# Patient Record
Sex: Male | Born: 1945
Health system: Southern US, Community
[De-identification: ages and names within clinical notes are randomized; demographics above are authoritative.]

## PROBLEM LIST (undated history)

## (undated) DIAGNOSIS — E538 Deficiency of other specified B group vitamins: Secondary | ICD-10-CM

## (undated) DIAGNOSIS — I10 Essential (primary) hypertension: Secondary | ICD-10-CM

## (undated) DIAGNOSIS — N400 Enlarged prostate without lower urinary tract symptoms: Secondary | ICD-10-CM

## (undated) DIAGNOSIS — Z860100 Personal history of colon polyps, unspecified: Secondary | ICD-10-CM

## (undated) DIAGNOSIS — C4490 Unspecified malignant neoplasm of skin, unspecified: Secondary | ICD-10-CM

## (undated) DIAGNOSIS — D649 Anemia, unspecified: Secondary | ICD-10-CM

## (undated) DIAGNOSIS — E785 Hyperlipidemia, unspecified: Secondary | ICD-10-CM

## (undated) DIAGNOSIS — R739 Hyperglycemia, unspecified: Secondary | ICD-10-CM

## (undated) HISTORY — DX: Hyperglycemia, unspecified: R73.9

## (undated) HISTORY — DX: Essential (primary) hypertension: I10

## (undated) HISTORY — PX: ELBOW SURGERY: SHX618

## (undated) HISTORY — DX: Personal history of colon polyps, unspecified: Z86.0100

## (undated) HISTORY — PX: BREAST EXCISIONAL BIOPSY: SUR124

## (undated) HISTORY — DX: Anemia, unspecified: D64.9

## (undated) HISTORY — DX: Deficiency of other specified B group vitamins: E53.8

## (undated) HISTORY — DX: Benign prostatic hyperplasia without lower urinary tract symptoms: N40.0

## (undated) HISTORY — PX: VASECTOMY: SHX75

## (undated) HISTORY — DX: Hyperlipidemia, unspecified: E78.5

## (undated) HISTORY — PX: OTHER SURGICAL HISTORY: SHX169

## (undated) HISTORY — PX: KNEE ARTHROSCOPY: SHX127

## (undated) HISTORY — DX: Personal history of colonic polyps: Z86.010

## (undated) HISTORY — DX: Unspecified malignant neoplasm of skin, unspecified: C44.90

## (undated) HISTORY — PX: REFRACTIVE SURGERY: SHX103

## (undated) HISTORY — PX: POLYPECTOMY: SHX149

---

## 2001-10-19 ENCOUNTER — Ambulatory Visit (HOSPITAL_BASED_OUTPATIENT_CLINIC_OR_DEPARTMENT_OTHER): Admission: RE | Admit: 2001-10-19 | Discharge: 2001-10-19 | Payer: Self-pay | Admitting: Internal Medicine

## 2002-11-08 HISTORY — PX: OTHER SURGICAL HISTORY: SHX169

## 2004-01-04 ENCOUNTER — Ambulatory Visit (HOSPITAL_COMMUNITY): Admission: RE | Admit: 2004-01-04 | Discharge: 2004-01-04 | Payer: Self-pay | Admitting: Orthopedic Surgery

## 2004-10-08 ENCOUNTER — Ambulatory Visit: Payer: Self-pay | Admitting: Internal Medicine

## 2004-12-16 ENCOUNTER — Ambulatory Visit: Payer: Self-pay | Admitting: Internal Medicine

## 2004-12-16 ENCOUNTER — Encounter: Admission: RE | Admit: 2004-12-16 | Discharge: 2004-12-16 | Payer: Self-pay | Admitting: Internal Medicine

## 2005-01-15 ENCOUNTER — Ambulatory Visit: Payer: Self-pay | Admitting: Gastroenterology

## 2005-02-25 ENCOUNTER — Ambulatory Visit: Payer: Self-pay | Admitting: Gastroenterology

## 2005-03-11 ENCOUNTER — Ambulatory Visit: Payer: Self-pay | Admitting: Internal Medicine

## 2005-07-15 ENCOUNTER — Ambulatory Visit: Payer: Self-pay | Admitting: Internal Medicine

## 2005-09-09 ENCOUNTER — Ambulatory Visit: Payer: Self-pay | Admitting: Internal Medicine

## 2006-09-12 ENCOUNTER — Ambulatory Visit: Payer: Self-pay | Admitting: Internal Medicine

## 2006-09-12 LAB — CONVERTED CEMR LAB
ALT: 25 units/L (ref 0–40)
Basophils Relative: 0.2 % (ref 0.0–1.0)
CO2: 31 meq/L (ref 19–32)
Calcium: 10 mg/dL (ref 8.4–10.5)
Chloride: 102 meq/L (ref 96–112)
Creatinine, Ser: 1.1 mg/dL (ref 0.4–1.5)
Creatinine,U: 102.8 mg/dL
GFR calc non Af Amer: 73 mL/min
Glomerular Filtration Rate, Af Am: 88 mL/min/{1.73_m2}
Glucose, Bld: 111 mg/dL — ABNORMAL HIGH (ref 70–99)
Lymphocytes Relative: 32.7 % (ref 12.0–46.0)
MCHC: 34 g/dL (ref 30.0–36.0)
MCV: 93.5 fL (ref 78.0–100.0)
Microalb Creat Ratio: 1.9 mg/g (ref 0.0–30.0)
Microalb, Ur: 0.2 mg/dL (ref 0.0–1.9)
Monocytes Absolute: 0.5 10*3/uL (ref 0.2–0.7)
Neutro Abs: 3.5 10*3/uL (ref 1.4–7.7)
PSA: 0.29 ng/mL (ref 0.10–4.00)
Platelets: 279 10*3/uL (ref 150–400)
RBC: 4.46 M/uL (ref 4.22–5.81)
Sodium: 140 meq/L (ref 135–145)
TSH: 1.25 microintl units/mL (ref 0.35–5.50)
WBC: 6 10*3/uL (ref 4.5–10.5)

## 2007-04-13 ENCOUNTER — Telehealth (INDEPENDENT_AMBULATORY_CARE_PROVIDER_SITE_OTHER): Payer: Self-pay | Admitting: *Deleted

## 2007-08-07 ENCOUNTER — Telehealth (INDEPENDENT_AMBULATORY_CARE_PROVIDER_SITE_OTHER): Payer: Self-pay | Admitting: *Deleted

## 2007-12-04 ENCOUNTER — Ambulatory Visit: Payer: Self-pay | Admitting: Internal Medicine

## 2007-12-04 DIAGNOSIS — M109 Gout, unspecified: Secondary | ICD-10-CM | POA: Insufficient documentation

## 2007-12-05 ENCOUNTER — Encounter (INDEPENDENT_AMBULATORY_CARE_PROVIDER_SITE_OTHER): Payer: Self-pay | Admitting: *Deleted

## 2008-02-02 ENCOUNTER — Telehealth (INDEPENDENT_AMBULATORY_CARE_PROVIDER_SITE_OTHER): Payer: Self-pay | Admitting: *Deleted

## 2008-02-05 ENCOUNTER — Telehealth (INDEPENDENT_AMBULATORY_CARE_PROVIDER_SITE_OTHER): Payer: Self-pay | Admitting: *Deleted

## 2008-10-10 ENCOUNTER — Telehealth (INDEPENDENT_AMBULATORY_CARE_PROVIDER_SITE_OTHER): Payer: Self-pay | Admitting: *Deleted

## 2008-12-05 ENCOUNTER — Ambulatory Visit: Payer: Self-pay | Admitting: Internal Medicine

## 2008-12-05 DIAGNOSIS — R7309 Other abnormal glucose: Secondary | ICD-10-CM | POA: Insufficient documentation

## 2008-12-05 DIAGNOSIS — N400 Enlarged prostate without lower urinary tract symptoms: Secondary | ICD-10-CM | POA: Insufficient documentation

## 2008-12-05 DIAGNOSIS — E785 Hyperlipidemia, unspecified: Secondary | ICD-10-CM | POA: Insufficient documentation

## 2008-12-05 DIAGNOSIS — I10 Essential (primary) hypertension: Secondary | ICD-10-CM | POA: Insufficient documentation

## 2008-12-09 ENCOUNTER — Encounter: Payer: Self-pay | Admitting: Internal Medicine

## 2009-07-01 ENCOUNTER — Ambulatory Visit: Payer: Self-pay | Admitting: Internal Medicine

## 2009-07-04 LAB — CONVERTED CEMR LAB
Basophils Absolute: 0 10*3/uL (ref 0.0–0.1)
Eosinophils Relative: 1.4 % (ref 0.0–5.0)
Folate: 17 ng/mL
HCT: 37.7 % — ABNORMAL LOW (ref 39.0–52.0)
Iron: 123 ug/dL (ref 42–165)
Lymphocytes Relative: 32 % (ref 12.0–46.0)
Lymphs Abs: 1.9 10*3/uL (ref 0.7–4.0)
Monocytes Relative: 8.7 % (ref 3.0–12.0)
Neutrophils Relative %: 57.7 % (ref 43.0–77.0)
Platelets: 223 10*3/uL (ref 150.0–400.0)
RDW: 12.7 % (ref 11.5–14.6)
Saturation Ratios: 27.7 % (ref 20.0–50.0)
T3 Uptake Ratio: 35.6 % (ref 22.5–37.0)
Transferrin: 317.5 mg/dL (ref 212.0–360.0)
Vitamin B-12: 205 pg/mL — ABNORMAL LOW (ref 211–911)
WBC: 6 10*3/uL (ref 4.5–10.5)

## 2009-07-07 ENCOUNTER — Telehealth (INDEPENDENT_AMBULATORY_CARE_PROVIDER_SITE_OTHER): Payer: Self-pay | Admitting: *Deleted

## 2009-07-07 ENCOUNTER — Encounter (INDEPENDENT_AMBULATORY_CARE_PROVIDER_SITE_OTHER): Payer: Self-pay | Admitting: *Deleted

## 2009-07-10 ENCOUNTER — Ambulatory Visit: Payer: Self-pay | Admitting: Internal Medicine

## 2009-07-10 LAB — CONVERTED CEMR LAB: OCCULT 3: NEGATIVE

## 2009-07-11 ENCOUNTER — Encounter (INDEPENDENT_AMBULATORY_CARE_PROVIDER_SITE_OTHER): Payer: Self-pay | Admitting: *Deleted

## 2009-09-18 ENCOUNTER — Telehealth (INDEPENDENT_AMBULATORY_CARE_PROVIDER_SITE_OTHER): Payer: Self-pay | Admitting: *Deleted

## 2009-12-08 ENCOUNTER — Ambulatory Visit: Payer: Self-pay | Admitting: Internal Medicine

## 2010-01-23 ENCOUNTER — Encounter (INDEPENDENT_AMBULATORY_CARE_PROVIDER_SITE_OTHER): Payer: Self-pay | Admitting: *Deleted

## 2010-01-28 ENCOUNTER — Telehealth: Payer: Self-pay | Admitting: Internal Medicine

## 2010-02-12 ENCOUNTER — Encounter (INDEPENDENT_AMBULATORY_CARE_PROVIDER_SITE_OTHER): Payer: Self-pay | Admitting: *Deleted

## 2010-02-23 ENCOUNTER — Encounter (INDEPENDENT_AMBULATORY_CARE_PROVIDER_SITE_OTHER): Payer: Self-pay | Admitting: *Deleted

## 2010-02-25 ENCOUNTER — Ambulatory Visit: Payer: Self-pay | Admitting: Gastroenterology

## 2010-03-08 HISTORY — PX: COLONOSCOPY W/ POLYPECTOMY: SHX1380

## 2010-03-10 ENCOUNTER — Ambulatory Visit: Payer: Self-pay | Admitting: Gastroenterology

## 2010-03-10 LAB — HM COLONOSCOPY

## 2010-03-12 ENCOUNTER — Encounter: Payer: Self-pay | Admitting: Gastroenterology

## 2010-06-01 ENCOUNTER — Ambulatory Visit: Payer: Self-pay | Admitting: Internal Medicine

## 2010-06-09 LAB — CONVERTED CEMR LAB
Basophils Absolute: 0 10*3/uL (ref 0.0–0.1)
Eosinophils Absolute: 0.1 10*3/uL (ref 0.0–0.7)
Hgb A1c MFr Bld: 6.1 % (ref 4.6–6.5)
Lymphocytes Relative: 38 % (ref 12.0–46.0)
MCHC: 35.1 g/dL (ref 30.0–36.0)
Neutrophils Relative %: 51.3 % (ref 43.0–77.0)
RDW: 13.5 % (ref 11.5–14.6)
Vitamin B-12: 183 pg/mL — ABNORMAL LOW (ref 211–911)

## 2010-06-17 ENCOUNTER — Ambulatory Visit: Payer: Self-pay | Admitting: Internal Medicine

## 2010-06-17 DIAGNOSIS — D510 Vitamin B12 deficiency anemia due to intrinsic factor deficiency: Secondary | ICD-10-CM | POA: Insufficient documentation

## 2010-09-01 ENCOUNTER — Ambulatory Visit: Payer: Self-pay | Admitting: Internal Medicine

## 2010-09-01 DIAGNOSIS — M79609 Pain in unspecified limb: Secondary | ICD-10-CM | POA: Insufficient documentation

## 2010-09-01 DIAGNOSIS — R109 Unspecified abdominal pain: Secondary | ICD-10-CM | POA: Insufficient documentation

## 2010-09-16 ENCOUNTER — Ambulatory Visit: Payer: Self-pay | Admitting: Internal Medicine

## 2010-10-08 ENCOUNTER — Ambulatory Visit: Payer: Self-pay | Admitting: Sports Medicine

## 2010-10-08 DIAGNOSIS — M722 Plantar fascial fibromatosis: Secondary | ICD-10-CM | POA: Insufficient documentation

## 2010-11-23 ENCOUNTER — Telehealth (INDEPENDENT_AMBULATORY_CARE_PROVIDER_SITE_OTHER): Payer: Self-pay | Admitting: *Deleted

## 2010-12-06 LAB — CONVERTED CEMR LAB
ALT: 22 units/L (ref 0–53)
AST: 25 units/L (ref 0–37)
AST: 29 units/L (ref 0–37)
Albumin: 4.4 g/dL (ref 3.5–5.2)
Albumin: 4.6 g/dL (ref 3.5–5.2)
Alkaline Phosphatase: 32 units/L — ABNORMAL LOW (ref 39–117)
Alkaline Phosphatase: 37 units/L — ABNORMAL LOW (ref 39–117)
BUN: 17 mg/dL (ref 6–23)
BUN: 20 mg/dL (ref 6–23)
Basophils Absolute: 0 10*3/uL (ref 0.0–0.1)
Basophils Absolute: 0 10*3/uL (ref 0.0–0.1)
Bilirubin, Direct: 0.1 mg/dL (ref 0.0–0.3)
Bilirubin, Direct: 0.1 mg/dL (ref 0.0–0.3)
CO2: 29 meq/L (ref 19–32)
CO2: 30 meq/L (ref 19–32)
Calcium: 10 mg/dL (ref 8.4–10.5)
Chloride: 100 meq/L (ref 96–112)
Chloride: 104 meq/L (ref 96–112)
Cholesterol, target level: 200 mg/dL
Cholesterol: 183 mg/dL (ref 0–200)
Creatinine, Ser: 1.2 mg/dL (ref 0.4–1.5)
Direct LDL: 103.8 mg/dL
Eosinophils Absolute: 0 10*3/uL (ref 0.0–0.7)
Eosinophils Absolute: 0.1 10*3/uL (ref 0.0–0.7)
Eosinophils Relative: 0.7 % (ref 0.0–5.0)
Folate: 10.4 ng/mL
GFR calc Af Amer: 88 mL/min
GFR calc non Af Amer: 65.16 mL/min (ref >60)
GFR calc non Af Amer: 72 mL/min
Glucose, Bld: 101 mg/dL — ABNORMAL HIGH (ref 70–99)
Glucose, Bld: 107 mg/dL — ABNORMAL HIGH (ref 70–99)
HDL goal, serum: 40 mg/dL
HDL: 55.9 mg/dL (ref >39.0)
HDL: 57.2 mg/dL (ref >39.0)
HDL: 74.9 mg/dL (ref >39.00)
Hemoglobin: 12.5 g/dL — ABNORMAL LOW (ref 13.0–17.0)
Hemoglobin: 13.3 g/dL (ref 13.0–17.0)
Hgb A1c MFr Bld: 5.7 % (ref 4.6–6.0)
Hgb A1c MFr Bld: 5.8 % (ref 4.6–6.0)
Hgb A1c MFr Bld: 6 % (ref 4.6–6.5)
Iron: 114 ug/dL (ref 42–165)
Lymphocytes Relative: 35 % (ref 12.0–46.0)
Lymphocytes Relative: 35.1 % (ref 12.0–46.0)
Lymphs Abs: 1.8 10*3/uL (ref 0.7–4.0)
MCHC: 33.5 g/dL (ref 30.0–36.0)
MCHC: 33.8 g/dL (ref 30.0–36.0)
MCV: 93.7 fL (ref 78.0–100.0)
Microalb Creat Ratio: 2.8 mg/g (ref 0.0–30.0)
Monocytes Absolute: 0.5 10*3/uL (ref 0.2–0.7)
Monocytes Absolute: 0.6 10*3/uL (ref 0.1–1.0)
Monocytes Relative: 10.5 % (ref 3.0–12.0)
Monocytes Relative: 7.6 % (ref 3.0–11.0)
Neutro Abs: 2.9 10*3/uL (ref 1.4–7.7)
Neutro Abs: 4 10*3/uL (ref 1.4–7.7)
Neutrophils Relative %: 63.6 % (ref 43.0–77.0)
PSA: 0.27 ng/mL (ref 0.10–4.00)
PSA: 0.28 ng/mL (ref 0.10–4.00)
Platelets: 209 10*3/uL (ref 150–400)
Platelets: 214 10*3/uL (ref 150.0–400.0)
Platelets: 263 10*3/uL (ref 150–400)
Potassium: 4.2 meq/L (ref 3.5–5.1)
RDW: 12 % (ref 11.5–14.6)
RDW: 12.4 % (ref 11.5–14.6)
Sodium: 136 meq/L (ref 135–145)
Sodium: 140 meq/L (ref 135–145)
Sodium: 142 meq/L (ref 135–145)
TSH: 0.82 microintl units/mL (ref 0.35–5.50)
TSH: 1.4 microintl units/mL (ref 0.35–5.50)
Total Bilirubin: 0.4 mg/dL (ref 0.3–1.2)
Total Bilirubin: 0.7 mg/dL (ref 0.3–1.2)
Total CHOL/HDL Ratio: 2.7
Total CHOL/HDL Ratio: 3
Total CHOL/HDL Ratio: 3.3
Total Protein: 7.1 g/dL (ref 6.0–8.3)
Triglycerides: 123 mg/dL (ref 0–149)
Uric Acid, Serum: 6.2 mg/dL (ref 4.0–7.8)
VLDL: 25 mg/dL (ref 0–40)
VLDL: 35.2 mg/dL (ref 0.0–40.0)
Vitamin B-12: 356 pg/mL (ref 211–911)
WBC: 6.1 10*3/uL (ref 4.5–10.5)

## 2010-12-09 ENCOUNTER — Encounter (INDEPENDENT_AMBULATORY_CARE_PROVIDER_SITE_OTHER): Payer: Managed Care, Other (non HMO) | Admitting: Internal Medicine

## 2010-12-09 ENCOUNTER — Ambulatory Visit: Admit: 2010-12-09 | Payer: Self-pay | Admitting: Internal Medicine

## 2010-12-09 ENCOUNTER — Other Ambulatory Visit: Payer: Self-pay | Admitting: Internal Medicine

## 2010-12-09 ENCOUNTER — Encounter: Payer: Self-pay | Admitting: Internal Medicine

## 2010-12-09 DIAGNOSIS — D510 Vitamin B12 deficiency anemia due to intrinsic factor deficiency: Secondary | ICD-10-CM

## 2010-12-09 DIAGNOSIS — Z136 Encounter for screening for cardiovascular disorders: Secondary | ICD-10-CM

## 2010-12-09 DIAGNOSIS — E785 Hyperlipidemia, unspecified: Secondary | ICD-10-CM

## 2010-12-09 DIAGNOSIS — Z860100 Personal history of colon polyps, unspecified: Secondary | ICD-10-CM | POA: Insufficient documentation

## 2010-12-09 DIAGNOSIS — M109 Gout, unspecified: Secondary | ICD-10-CM

## 2010-12-09 DIAGNOSIS — N400 Enlarged prostate without lower urinary tract symptoms: Secondary | ICD-10-CM

## 2010-12-09 DIAGNOSIS — Z0000 Encounter for general adult medical examination without abnormal findings: Secondary | ICD-10-CM

## 2010-12-09 DIAGNOSIS — R7309 Other abnormal glucose: Secondary | ICD-10-CM

## 2010-12-09 DIAGNOSIS — I10 Essential (primary) hypertension: Secondary | ICD-10-CM

## 2010-12-09 LAB — CBC WITH DIFFERENTIAL/PLATELET
Basophils Relative: 0.4 % (ref 0.0–3.0)
Eosinophils Absolute: 0.1 10*3/uL (ref 0.0–0.7)
MCHC: 34.9 g/dL (ref 30.0–36.0)
MCV: 95.2 fl (ref 78.0–100.0)
Monocytes Absolute: 0.5 10*3/uL (ref 0.1–1.0)
Neutrophils Relative %: 54.9 % (ref 43.0–77.0)
RBC: 3.74 Mil/uL — ABNORMAL LOW (ref 4.22–5.81)

## 2010-12-09 LAB — BASIC METABOLIC PANEL
BUN: 21 mg/dL (ref 6–23)
CO2: 29 mEq/L (ref 19–32)
Chloride: 104 mEq/L (ref 96–112)
Creatinine, Ser: 1.2 mg/dL (ref 0.4–1.5)
Glucose, Bld: 105 mg/dL — ABNORMAL HIGH (ref 70–99)

## 2010-12-09 LAB — TSH: TSH: 1.12 u[IU]/mL (ref 0.35–5.50)

## 2010-12-09 LAB — LDL CHOLESTEROL, DIRECT: Direct LDL: 100.3 mg/dL

## 2010-12-09 LAB — B12 AND FOLATE PANEL: Folate: 20.3 ng/mL (ref >5.9)

## 2010-12-09 LAB — PSA: PSA: 0.23 ng/mL (ref 0.10–4.00)

## 2010-12-09 LAB — IBC PANEL: Saturation Ratios: 22.3 % (ref 20.0–50.0)

## 2010-12-09 LAB — LIPID PANEL
Cholesterol: 193 mg/dL (ref 0–200)
Total CHOL/HDL Ratio: 3

## 2010-12-09 LAB — URIC ACID: Uric Acid, Serum: 6.4 mg/dL (ref 4.0–7.8)

## 2010-12-09 LAB — HEPATIC FUNCTION PANEL: Total Bilirubin: 0.4 mg/dL (ref 0.3–1.2)

## 2010-12-09 LAB — MICROALBUMIN / CREATININE URINE RATIO: Microalb, Ur: 0.2 mg/dL (ref 0.0–1.9)

## 2010-12-09 LAB — HEMOGLOBIN A1C: Hgb A1c MFr Bld: 6 % (ref 4.6–6.5)

## 2010-12-10 NOTE — Progress Notes (Signed)
Summary: Refill Request  Phone Note Refill Request Message from:  Fax from Pharmacy on November 23, 2010 9:58 AM  Refills Requested: Medication #1:  FENOFIBRATE 160 MG  TABS 1 by mouth qd  Medication #2:  RAMIPRIL 10 MG  CAPS 1 by mouth qd  Medication #3:  AMLODIPINE BESYLATE 5 MG TABS 1 once daily  Medication #4:  ACTOS TAB 15MG    Notes: Medication list says this was removed 09/26/10 due to bladder cancer risk [facility]    phone -[phone]   fax - [phone]    (use ref# [id number] if calling)  Next Appointment Scheduled: Wed 2/1     [facility] Initial call taken by: Jerolyn Shin,  November 23, 2010 10:11 AM  Follow-up for Phone Call        RX's faxed to: [phone]  **ACTOS NOT FAXED, REMOVED** Follow-up by: Shonna Chock CMA,  November 23, 2010 4:36 PM    Prescriptions: RAMIPRIL 10 MG  CAPS (RAMIPRIL) 1 by mouth qd  #90 x 0   Entered by:   Shonna Chock CMA   Authorized by:   Marga Melnick MD   Signed by:   Shonna Chock CMA on 11/23/2010   Method used:   Print then Give to Patient   RxID:   [id number] AMLODIPINE BESYLATE 5 MG TABS (AMLODIPINE BESYLATE) 1 once daily  #90 x 0   Entered by:   Shonna Chock CMA   Authorized by:   Marga Melnick MD   Signed by:   Shonna Chock CMA on 11/23/2010   Method used:   Print then Give to Patient   RxID:   [id number] FENOFIBRATE 160 MG  TABS (FENOFIBRATE) 1 by mouth qd  #90 x 0   Entered by:   Shonna Chock CMA   Authorized by:   Marga Melnick MD   Signed by:   Shonna Chock CMA on 11/23/2010   Method used:   Print then Give to Patient   RxID:   [id number]

## 2010-12-10 NOTE — Assessment & Plan Note (Signed)
Summary: CPX AND LAB/CDJ   Vital Signs:  Patient profile:   65 year old male Height:      69.5 inches Weight:      206 pounds BMI:     30.09 Temp:     98.6 degrees F oral Pulse rate:   52 / minute Resp:     14 per minute BP sitting:   124 / 86  (left arm) Cuff size:   large  Vitals Entered By: Shonna Chock (December 08, 2009 8:27 AM)  Comments REVIEWED MED LIST, PATIENT AGREED DOSE AND INSTRUCTION CORRECT    History of Present Illness: Troy House is here for a physical; he is asymptomatic. Flu shot taken 08/2009.  Preventive Screening-Counseling & Management  Caffeine-Diet-Exercise     Does Patient Exercise: no  Allergies: 1)  ! Pcn 2)  ! Hydrocodone  Past History:  Past Medical History: Gout, PMH of ( last flare 2006) Borderline anemia 2000 (B12,folate,iron panel WNL, PMH of ;Active  Blood Donor(once / year) Hyperlipidemia Hypertension Hyperglycemia, fasting , PMH of Benign prostatic hypertrophy  Past Surgical History: Vasectomy Elbow surgery bilaterally for osteophytes Lump removed Lt breast ( benign) Arthroscopy Lt knee Lasik sx OD X 2 Frozen Lt shoulder sx Colonoscopy neg 2001; Endo 2001 neg, Dr Arlyce Dice  Family History: Family History Other cancer-testicular MGF Father: HTN,CVA @ 56,MI @ 37(3 ppd smoker) Mother: adrenal insufficiency ?due to steroids for ?,HTN Siblings: sister ? Lupus Variant; sister DM,resolved post Bariatric Surgery; Niece Bipola disorder; Nephew ADHD; Niece ADHD; sister ?thyroid malignancy 08/2008,S/P resection & radiation(18 days)  Social History: Former Smoker: quit 1974 Alcohol use-yes: socially Occupation: Lawyer; starting an on line business Married Regular exercise-no(since 10/2009 due to work issues) Does Patient Exercise:  no  Review of Systems       The patient complains of decreased hearing.  The patient denies anorexia, fever, vision loss, hoarseness, chest pain, syncope, dyspnea on exertion, peripheral edema,  abdominal pain, melena, hematochezia, severe indigestion/heartburn, suspicious skin lesions, depression, unusual weight change, abnormal bleeding, enlarged lymph nodes, and angioedema.         Weight up 4# over holidays. Hearing aids bilaterally (dog chewed  them up  12/03/2009 ! ) Resp:  Denies cough, excessive snoring, hypersomnolence, morning headaches, shortness of breath, and sputum productive; He takes afternoon nap.. GU:  Denies discharge, dysuria, hematuria, incontinence, urinary frequency, and urinary hesitancy; Nocturia X 0-3 depending on fluid intake.  Physical Exam  General:  well-nourished,in no acute distress; alert,appropriate and cooperative throughout examination Head:  Normocephalic and atraumatic without obvious abnormalities. Pattern  alopecia Eyes:  No corneal or conjunctival inflammation noted.  Perrla. Funduscopic exam benign, without hemorrhages, exudates or papilledema.  Ears:  External ear exam shows no significant lesions or deformities.  Otoscopic examination reveals clear canals, tympanic membranes are intact bilaterally without bulging, retraction, inflammation or discharge. Hearing is grossly normal bilaterally to speech ( loss of high tones by PMH). Nose:  External nasal examination shows no deformity or inflammation. Nasal mucosa are pink and moist without lesions or exudates. Mouth:  Oral mucosa and oropharynx without lesions or exudates.  Teeth in good repair. Neck:  No deformities, masses, or tenderness noted. Lungs:  Normal respiratory effort, chest expands symmetrically. Lungs are clear to auscultation, no crackles or wheezes. Heart:  regular rhythm and bradycardia.   S 4 with slurring Abdomen:  Bowel sounds positive,abdomen soft and non-tender without masses, organomegaly or hernias noted. Rectal:  No external abnormalities noted. Normal sphincter tone. No rectal  masses or tenderness. Genitalia:  Testes bilaterally descended without nodularity, tenderness or  masses. Scar tissue L scrotum.No penis lesions or urethral discharge. Prostate:  Prostate gland firm and smooth,ULN to 1+  enlargement, w/o nodularity, tenderness, mass, asymmetry or induration. Msk:  No deformity or scoliosis noted of thoracic or lumbar spine.   Pulses:  R and L carotid,radial,dorsalis pedis and posterior tibial pulses are full and equal bilaterally Extremities:  No clubbing, cyanosis, edema, or deformity noted with normal full range of motion of all joints.   Neurologic:  alert & oriented X3 and DTRs symmetrical and normal.   Skin:  Intact without suspicious lesions or rashes Cervical Nodes:  No lymphadenopathy noted Axillary Nodes:  No palpable lymphadenopathy Inguinal Nodes:  No significant adenopathy Psych:  memory intact for recent and remote, normally interactive, and good eye contact.     Impression & Recommendations:  Problem # 1:  PREVENTIVE HEALTH CARE (ICD-V70.0)  Orders: Venipuncture (59563) TLB-Lipid Panel (80061-LIPID) TLB-B12 + Folate Pnl (87564_33295-J88/CZY) TLB-IBC Pnl (Iron/FE;Transferrin) (83550-IBC) TLB-BMP (Basic Metabolic Panel-BMET) (80048-METABOL) TLB-CBC Platelet - w/Differential (85025-CBCD) TLB-Hepatic/Liver Function Pnl (80076-HEPATIC) TLB-TSH (Thyroid Stimulating Hormone) (84443-TSH) TLB-Uric Acid, Blood (84550-URIC) TLB-A1C / Hgb A1C (Glycohemoglobin) (83036-A1C) TLB-PSA (Prostate Specific Antigen) (84153-PSA) EKG w/ Interpretation (93000) Gastroenterology Referral (GI)  Problem # 2:  HYPERLIPIDEMIA (ICD-272.4)  His updated medication list for this problem includes:    Fenofibrate 160 Mg Tabs (Fenofibrate) .Marland Kitchen... 1 by mouth qd  Orders: Venipuncture (60630) TLB-Lipid Panel (80061-LIPID)  Problem # 3:  HYPERTENSION (ICD-401.9)  Controlled His updated medication list for this problem includes:    Ramipril 10 Mg Caps (Ramipril) .Marland Kitchen... 1 by mouth qd    Amlodipine Besylate 5 Mg Tabs (Amlodipine besylate) .Marland Kitchen... 1 once  daily  Orders: Venipuncture (16010) EKG w/ Interpretation (93000)  Problem # 4:  ANEMIA, MILD (ICD-285.9)  PMH of His updated medication list for this problem includes:    Nascobal 500 Mcg/0.32ml Soln (Cyanocobalamin) ..... One spray, one nostril, once a week  Orders: Venipuncture (93235) TLB-B12 + Folate Pnl (57322_02542-H06/CBJ) TLB-IBC Pnl (Iron/FE;Transferrin) (83550-IBC)  Problem # 5:  FASTING HYPERGLYCEMIA (ICD-790.29)  His updated medication list for this problem includes:    Actos 15 Mg Tabs (Pioglitazone hcl) .Marland Kitchen... 1 by mouth qd  Orders: Venipuncture (62831) TLB-A1C / Hgb A1C (Glycohemoglobin) (83036-A1C)  Problem # 6:  HYPERPLASIA PROSTATE UNS W/O UR OBST & OTH LUTS (ICD-600.90)  Orders: TLB-PSA (Prostate Specific Antigen) (84153-PSA)  Problem # 7:  SCREENING, COLON CANCER (ICD-V76.51)  due for screening, last in 2001  Orders: Gastroenterology Referral (GI)  Problem # 8:  GOUT (ICD-274.9)  His updated medication list for this problem includes:    Allopurinol 300 Mg Tabs (Allopurinol) .Marland Kitchen... 1/2 tab qd  Orders: Venipuncture (51761) TLB-Uric Acid, Blood (84550-URIC)  Complete Medication List: 1)  Allopurinol 300 Mg Tabs (Allopurinol) .... 1/2 tab qd 2)  Ramipril 10 Mg Caps (Ramipril) .Marland Kitchen.. 1 by mouth qd 3)  Actos 15 Mg Tabs (Pioglitazone hcl) .Marland Kitchen.. 1 by mouth qd 4)  Fenofibrate 160 Mg Tabs (Fenofibrate) .Marland Kitchen.. 1 by mouth qd 5)  Aspirin Adult Low Strength 81 Mg Tbec (Aspirin) .... 3 by mouth once daily 6)  Amlodipine Besylate 5 Mg Tabs (Amlodipine besylate) .Marland Kitchen.. 1 once daily 7)  Nascobal 500 Mcg/0.44ml Soln (Cyanocobalamin) .... One spray, one nostril, once a week 8)  Indomethacin 50 Mg Caps (Indomethacin) .Marland Kitchen.. 1 by mouth three times a day as needed 9)  Vitamin D3 1000 Unit Caps (Cholecalciferol) .Marland Kitchen.. 1 by mouth once  daily 10)  Epi Pen  .... As needed 11)  Viagra 100 Mg Tabs (Sildenafil citrate) .... 1/2 -1 once daily as needed  Patient Instructions: 1)   Check your Blood Pressure regularly. If it is above:135/85 ON AVERAGE  you should make an appointment. 2)  A colonoscopy will be scheduled if due. Prescriptions: VIAGRA 100 MG TABS (SILDENAFIL CITRATE) 1/2 -1 once daily as needed  #6 x 11   Entered and Authorized by:   Marga Melnick MD   Signed by:   Marga Melnick MD on 12/08/2009   Method used:   Print then Give to Patient   RxID:   [id number] FENOFIBRATE 160 MG  TABS (FENOFIBRATE) 1 by mouth qd  #90 x 3   Entered and Authorized by:   Marga Melnick MD   Signed by:   Marga Melnick MD on 12/08/2009   Method used:   Print then Give to Patient   RxID:   [id number] EPI PEN as needed  #2 x 1   Entered and Authorized by:   Marga Melnick MD   Signed by:   Marga Melnick MD on 12/08/2009   Method used:   Print then Give to Patient   RxID:   [id number] NASCOBAL 500 MCG/0.1ML SOLN (CYANOCOBALAMIN) One spray, one nostril, once a week  #1 x 11   Entered and Authorized by:   Marga Melnick MD   Signed by:   Marga Melnick MD on 12/08/2009   Method used:   Print then Give to Patient   RxID:   [id number] AMLODIPINE BESYLATE 5 MG TABS (AMLODIPINE BESYLATE) 1 once daily  #90 x 3   Entered and Authorized by:   Marga Melnick MD   Signed by:   Marga Melnick MD on 12/08/2009   Method used:   Print then Give to Patient   RxID:   [id number] ACTOS 15 MG  TABS (PIOGLITAZONE HCL) 1 by mouth qd  #90 x 3   Entered and Authorized by:   Marga Melnick MD   Signed by:   Marga Melnick MD on 12/08/2009   Method used:   Print then Give to Patient   RxID:   [id number] RAMIPRIL 10 MG  CAPS (RAMIPRIL) 1 by mouth qd  #90 x 3   Entered and Authorized by:   Marga Melnick MD   Signed by:   Marga Melnick MD on 12/08/2009   Method used:   Print then Give to Patient   RxID:   [id number] ALLOPURINOL 300 MG  TABS (ALLOPURINOL) 1/2 tab qd  #90 x 1   Entered and Authorized by:   Marga Melnick MD   Signed by:   Marga Melnick MD on 12/08/2009   Method used:   Print then Give to Patient   RxID:   [id number]

## 2010-12-10 NOTE — Letter (Signed)
Summary: Patient Notice- Polyp Results  [facility]  [address] [place], [place] [address]   Phone: [phone]  Fax: [phone]        Mar 12, 2010 MRN: [id number]    Troy MAYSONET [address] [place], [place]  [address]    Dear Mr. Gullo,  I am pleased to inform you that the colon polyp(s) removed during your recent colonoscopy was (were) found to be benign (no cancer detected) upon pathologic examination.  I recommend you have a repeat colonoscopy examination in _5 years to look for recurrent polyps, as having colon polyps increases your risk for having recurrent polyps or even colon cancer in the future.  Should you develop new or worsening symptoms of abdominal pain, bowel habit changes or bleeding from the rectum or bowels, please schedule an evaluation with either your primary care physician or with me.  Additional information/recommendations:  __ No further action with gastroenterology is needed at this time. Please      follow-up with your primary care physician for your other healthcare      needs.  __ Please call [phone] to schedule a return visit to review your      situation.  __ Please keep your follow-up visit as already scheduled.  _x_ Continue treatment plan as outlined the day of your exam.  Please call us if you are having persistent problems or have questions about your condition that have not been fully answered at this time.  Sincerely,  Louis Meckel MD  This letter has been electronically signed by your physician.  Appended Document: Patient Notice- Polyp Results letter mailed 5.10.11

## 2010-12-10 NOTE — Assessment & Plan Note (Signed)
Summary: NP,HEEL PAIN,MC   Vital Signs:  Patient profile:   65 year old male BP sitting:   144 / 81  Vitals Entered By: Lillia Pauls CMA (October 08, 2010 2:23 PM)  History of Present Illness: Patient does regular square dancing has had some rare heel pain 2 mos ago after dancing pain became persistent and more severe over left heel  seen by Dr Alwyn Ren and started on Mobic which had helped pain about 60% referred here for eval  has morning pain pain if not wearing a birkenstock = which helps relieve pain well  Allergies: 1)  ! Pcn 2)  ! Hydrocodone  Physical Exam  General:  Well-developed,well-nourished,in no acute distress; alert,appropriate and cooperative throughout examination Msk:  Rt heel shows TTP over os calcis and also over medial insertion to PF good motin of great toe mild pronation on standing bilat mod loss of long arch mod loss of trans arch w 2nd MT head dropped somewhat bilat  walking gait shows no limp and mod pronation bilat Additional Exam:  MSK [place] thickened area of scar tissue over os calcis on RT with calcification in mid portion of PF This measures 0.72 PF on left measures only 0.47   Impression & Recommendations:  Problem # 1:  HEEL PAIN, RIGHT (ICD-729.5)  There is some element of os calcis contusion prob from dancing and note that his shoes have worn heels suggested changing these for dance most sxs seem 2/2 PF  Orders: [place] LIMITED (95284)  Problem # 2:  PLANTAR FASCIITIS, RIGHT (ICD-728.71)  His updated medication list for this problem includes:    Indomethacin 50 Mg Caps (Indomethacin) .Marland Kitchen... 1 by mouth three times a day as needed    Meloxicam 7.5 Mg Tabs (Meloxicam) .Marland Kitchen... 1 two times a day as needed pelvic pain(hold aspirin)  cont MOBIC as needed pain given exercises and stretches use arch strap for dancing  consider rescan in 3 mos if pain persists if steadily resolving may return prn  Orders: [place] LIMITED (13244)  Complete  Medication List: 1)  Allopurinol 300 Mg Tabs (Allopurinol) .... 1/2 tab qd 2)  Ramipril 10 Mg Caps (Ramipril) .Marland Kitchen.. 1 by mouth qd 3)  Fenofibrate 160 Mg Tabs (Fenofibrate) .Marland Kitchen.. 1 by mouth qd 4)  Amlodipine Besylate 5 Mg Tabs (Amlodipine besylate) .Marland Kitchen.. 1 once daily 5)  Indomethacin 50 Mg Caps (Indomethacin) .Marland Kitchen.. 1 by mouth three times a day as needed 6)  Vitamin D3 1000 Unit Caps (Cholecalciferol) .Marland Kitchen.. 1 by mouth once daily 7)  Epi Pen  .... As needed 8)  Viagra 100 Mg Tabs (Sildenafil citrate) .... 1/2 -1 once daily as needed 9)  Nascobal 500 Mcg/0.50ml Soln (Cyanocobalamin) .Marland Kitchen.. 1 spray weekly 10)  Meloxicam 7.5 Mg Tabs (Meloxicam) .Marland Kitchen.. 1 two times a day as needed pelvic pain(hold aspirin) 11)  Nitroglycerin 0.1 Mg/hr Pt24 (Nitroglycerin) .... Apply to area of pain once daily   Orders Added: 1)  Consultation Level II [99242] 2)  [place] LIMITED [01027]

## 2010-12-10 NOTE — Letter (Signed)
Summary: [facility] Instructions  [facility]  [address] [place], [place] [address]   Phone: [phone]  Fax: [phone]       Troy House    Dec 24, 1945    MRN: [id number]        Procedure Day /Date: Tuesday 03/10/2010     Arrival Time: 9:30 am      Procedure Time: 10:30 am     Location of Procedure:                    _ x_  [facility] (4th Floor)                        PREPARATION FOR COLONOSCOPY WITH MOVIPREP   Starting 5 days prior to your procedure Thursday 4/28 do not eat nuts, seeds, popcorn, corn, beans, peas,  salads, or any raw vegetables.  Do not take any fiber supplements (e.g. Metamucil, Citrucel, and Benefiber).  THE DAY BEFORE YOUR PROCEDURE         DATE: Monday 5/2_  1.  Drink clear liquids the entire day-NO SOLID FOOD  2.  Do not drink anything colored red or purple.  Avoid juices with pulp.  No orange juice.  3.  Drink at least 64 oz. (8 glasses) of fluid/clear liquids during the day to prevent dehydration and help the prep work efficiently.  CLEAR LIQUIDS INCLUDE: Water Jello Ice Popsicles Tea (sugar ok, no milk/cream) Powdered fruit flavored drinks Coffee (sugar ok, no milk/cream) Gatorade Juice: apple, white grape, white cranberry  Lemonade Clear bullion, consomm, broth Carbonated beverages (any kind) Strained chicken noodle soup Hard Candy                             4.  In the morning, mix first dose of MoviPrep solution:    Empty 1 Pouch A and 1 Pouch B into the disposable container    Add lukewarm drinking water to the top line of the container. Mix to dissolve    Refrigerate (mixed solution should be used within 24 hrs)  5.  Begin drinking the prep at 5:00 p.m. The MoviPrep container is divided by 4 marks.   Every 15 minutes drink the solution down to the next mark (approximately 8 oz) until the full liter is complete.   6.  Follow completed prep with 16 oz of clear liquid of your choice (Nothing red  or purple).  Continue to drink clear liquids until bedtime.  7.  Before going to bed, mix second dose of MoviPrep solution:    Empty 1 Pouch A and 1 Pouch B into the disposable container    Add lukewarm drinking water to the top line of the container. Mix to dissolve    Refrigerate  THE DAY OF YOUR PROCEDURE      DATE: Tuesday 5/3  Beginning at 5:30 a.m. (5 hours before procedure):         1. Every 15 minutes, drink the solution down to the next mark (approx 8 oz) until the full liter is complete.  2. Follow completed prep with 16 oz. of clear liquid of your choice.    3. You may drink clear liquids until 8:30 am (2 HOURS BEFORE PROCEDURE).   MEDICATION INSTRUCTIONS  Unless otherwise instructed, you should take regular prescription medications with a small sip of water   as early as possible the morning of your  procedure.           OTHER INSTRUCTIONS  You will need a responsible adult at least 64 years of age to accompany you and drive you home.   This person must remain in the waiting room during your procedure.  Wear loose fitting clothing that is easily removed.  Leave jewelry and other valuables at home.  However, you may wish to bring a book to read or  an iPod/MP3 player to listen to music as you wait for your procedure to start.  Remove all body piercing jewelry and leave at home.  Total time from sign-in until discharge is approximately 2-3 hours.  You should go home directly after your procedure and rest.  You can resume normal activities the  day after your procedure.  The day of your procedure you should not:   Drive   Make legal decisions   Operate machinery   Drink alcohol   Return to work  You will receive specific instructions about eating, activities and medications before you leave.    The above instructions have been reviewed and explained to me by   Clide Cliff, RN_______________________    I fully understand and can verbalize  these instructions _____________________________ Date _________

## 2010-12-10 NOTE — Progress Notes (Signed)
Summary: refill-hopp  Phone Note Refill Request Call back at Work Phone [phone] Message from:  Patient  Refills Requested: Medication #1:  INDOMETHACIN 50 MG CAPS 1 by mouth three times a day as needed [facility]   pt left vm that he went to pharmacy and med was not there and he had requested a refill. pt would also like to know when he is to f/u with dr Kolsen Choe. left message to call office.  per labs done 12-08-09 pt to f/u  In 6 months recheck CBC & B12 level & a Hemoglobin electrophoresis to evaluate the anemia......Marland KitchenFelecia Deloach CMA  January 28, 2010 12:13 PM   Initial call taken by: Jeremy Johann CMA,  January 28, 2010 10:41 AM  Follow-up for Phone Call        last OV 12-08-09, never filled by you.Felecia Deloach CMA  January 28, 2010 12:24 PM   pt aware, labs scheduled...........Marland KitchenFelecia Deloach CMA  January 28, 2010 12:24 PM   Additional Follow-up for Phone Call Additional follow up Details #1::        OK # 30 Additional Follow-up by: Marga Melnick MD,  January 28, 2010 2:14 PM    Additional Follow-up for Phone Call Additional follow up Details #2::    left pt detail message rx sent to pharmacy...............Marland KitchenFelecia Deloach CMA  January 28, 2010 2:23 PM   Prescriptions: INDOMETHACIN 50 MG CAPS (INDOMETHACIN) 1 by mouth three times a day as needed  #30 x 0   Entered by:   Jeremy Johann CMA   Authorized by:   Marga Melnick MD   Signed by:   Jeremy Johann CMA on 01/28/2010   Method used:   Faxed to ...       [facility] [phone][facility] (retail)       [address]       [place], [place]  [address]       Ph: [phone]       Fax: [phone]   RxID:   [id number]

## 2010-12-10 NOTE — Miscellaneous (Signed)
Summary: REC COL...AS.  Clinical Lists Changes  Medications: Added new medication of MOVIPREP 100 GM  SOLR (PEG-KCL-NACL-NASULF-NA ASC-C) As directed - Signed Rx of MOVIPREP 100 GM  SOLR (PEG-KCL-NACL-NASULF-NA ASC-C) As directed;  #1 x 0;  Signed;  Entered by: Clide Cliff RN;  Authorized by: Louis Meckel MD;  Method used: [facility] to [facility] [phone]*, [address] [place], [place], [place]  [address], Ph: [phone], Fax: [phone] Allergies: Changed allergy or adverse reaction from PCN to PCN Changed allergy or adverse reaction from HYDROCODONE to HYDROCODONE    Prescriptions: MOVIPREP 100 GM  SOLR (PEG-KCL-NACL-NASULF-NA ASC-C) As directed  #1 x 0   Entered by:   Clide Cliff RN   Authorized by:   Louis Meckel MD   Signed by:   Clide Cliff RN on 02/25/2010   Method used:   Electronically to        [facility] [phone]* (retail)       [address]       [place], [place]  [address]       Ph: [phone]       Fax: [phone]   RxID:   [id number]

## 2010-12-10 NOTE — Assessment & Plan Note (Signed)
Summary: PAIN BACK OF LEG & HEEL/CBS   Vital Signs:  Patient profile:   65 year old male Weight:      211.6 pounds BMI:     30.91 Temp:     98.3 degrees F oral Pulse rate:   56 / minute Resp:     16 per minute BP sitting:   114 / 78  (left arm) Cuff size:   large  Vitals Entered By: Shonna Chock CMA (September 01, 2010 1:54 PM) CC: Pain upper left leg and right heel pain ( since 3rd week in Aug-2011), Back pain, Lower Extremity Joint pain   CC:  Pain upper left leg and right heel pain ( since 3rd week in Aug-2011), Back pain, and Lower Extremity Joint pain.  History of Present Illness: Back Pain      This is a 65 year old man who presents with  sharp , constant  L buttock pain.  The patient denies fever, chills, weakness, loss of sensation, fecal incontinence, urinary incontinence, urinary retention, dysuria, and inability to work.  The pain is located in the left  inferior buttock  The pain began suddenly and after  camping in 03/2010  The pain does not  radiate.The pain is made worse by activity.  The pain is made better by NSAID medications, generic Indocin.  Risk factors for serious underlying conditions include duration of pain > 1 month and age >= 50 years.   Lower Extremity Joint Pain      The patient also presents with Lower Extremity Joint pain @ R heel since 3rd week in 06/2010.  The patient denies swelling, redness, and stiffness for >1 hr.  The pain began suddenly and with a direct blow, the next am after square dancing.  The pain is described as aching.  The patient denies the following symptoms: rash, photosensitivity, eye symptoms, diarrhea, and dysuria.  Indocin helps this also. PMH of gout , last flare 1 year ago."This is nothing loike gout".  Current Medications (verified): 1)  Allopurinol 300 Mg  Tabs (Allopurinol) .... 1/2 Tab Qd 2)  Ramipril 10 Mg  Caps (Ramipril) .Marland Kitchen.. 1 By Mouth Qd 3)  Actos 15 Mg  Tabs (Pioglitazone Hcl) .Marland Kitchen.. 1 By Mouth Qd 4)  Fenofibrate 160 Mg   Tabs (Fenofibrate) .Marland Kitchen.. 1 By Mouth Qd 5)  Aspirin Adult Low Strength 81 Mg Tbec (Aspirin) .... 3 By Mouth Once Daily 6)  Amlodipine Besylate 5 Mg Tabs (Amlodipine Besylate) .Marland Kitchen.. 1 Once Daily 7)  Indomethacin 50 Mg Caps (Indomethacin) .Marland Kitchen.. 1 By Mouth Three Times A Day As Needed 8)  Vitamin D3 1000 Unit Caps (Cholecalciferol) .Marland Kitchen.. 1 By Mouth Once Daily 9)  Epi Pen .... As Needed 10)  Viagra 100 Mg Tabs (Sildenafil Citrate) .... 1/2 -1 Once Daily As Needed 11)  Nascobal 500 Mcg/0.35ml Soln (Cyanocobalamin) .Marland Kitchen.. 1 Spray Weekly  Allergies: 1)  ! Pcn 2)  ! Hydrocodone  Review of Systems General:  Denies chills, sweats, and weight loss. GI:  Denies abdominal pain, bloody stools, and dark tarry stools. Derm:  Denies lesion(s) and rash. Neuro:  Denies brief paralysis, numbness, and tingling.  Physical Exam  General:  well-nourished,in no acute distress; alert,appropriate and cooperative throughout examination Abdomen:  Bowel sounds positive,abdomen soft and non-tender without masses, organomegaly or hernias noted. No AAA Genitalia:  Testes bilaterally descended without nodularity, tenderness or masses. No scrotal masses or lesions. No penis lesions or urethral discharge. L varicocele.   Msk:  Coccyx non tender to  palpation Pulses:  R and L femoral,dorsalis pedis and posterior tibial pulses are full and equal bilaterally Extremities:  No clubbing, cyanosis, edema, or deformity noted with normal full range of motion of all joints.   No clinical plantar fasciitis. Slight pes  planus. Neg SLR. No pain to compression or percussion  R foot Neurologic:  alert & oriented X3, strength normal in all extremities, toe  gait normal but pain R heel with heel walking, and DTRs symmetrical and normal.   Skin:  Intact without suspicious lesions or rashes Cervical Nodes:  No lymphadenopathy noted Axillary Nodes:  No palpable lymphadenopathy Inguinal Nodes:  No significant adenopathy   Impression &  Recommendations:  Problem # 1:  PELVIC PAIN, LEFT (ICD-789.09)  L inferior pelvis, not coccygeal pain His updated medication list for this problem includes:    Aspirin Adult Low Strength 81 Mg Tbec (Aspirin) .Marland KitchenMarland KitchenMarland KitchenMarland Kitchen 3 by mouth once daily    Indomethacin 50 Mg Caps (Indomethacin) .Marland Kitchen... 1 by mouth three times a day as needed    Meloxicam 7.5 Mg Tabs (Meloxicam) .Marland Kitchen... 1 two times a day as needed pelvic pain(hold aspirin)  Orders: T-Pelvis 1or 2 views (72170TC)  Problem # 2:  HEEL PAIN, RIGHT (ICD-729.5) Plantar Fasciitis  variant   Problem # 3:  GOUT (ICD-274.9) PMH of His updated medication list for this problem includes:    Allopurinol 300 Mg Tabs (Allopurinol) .Marland Kitchen... 1/2 tab qd  Complete Medication List: 1)  Allopurinol 300 Mg Tabs (Allopurinol) .... 1/2 tab qd 2)  Ramipril 10 Mg Caps (Ramipril) .Marland Kitchen.. 1 by mouth qd 3)  Actos 15 Mg Tabs (Pioglitazone hcl) .Marland Kitchen.. 1 by mouth qd 4)  Fenofibrate 160 Mg Tabs (Fenofibrate) .Marland Kitchen.. 1 by mouth qd 5)  Aspirin Adult Low Strength 81 Mg Tbec (Aspirin) .... 3 by mouth once daily 6)  Amlodipine Besylate 5 Mg Tabs (Amlodipine besylate) .Marland Kitchen.. 1 once daily 7)  Indomethacin 50 Mg Caps (Indomethacin) .Marland Kitchen.. 1 by mouth three times a day as needed 8)  Vitamin D3 1000 Unit Caps (Cholecalciferol) .Marland Kitchen.. 1 by mouth once daily 9)  Epi Pen  .... As needed 10)  Viagra 100 Mg Tabs (Sildenafil citrate) .... 1/2 -1 once daily as needed 11)  Nascobal 500 Mcg/0.27ml Soln (Cyanocobalamin) .Marland Kitchen.. 1 spray weekly 12)  Meloxicam 7.5 Mg Tabs (Meloxicam) .Marland Kitchen.. 1 two times a day as needed pelvic pain(hold aspirin)  Other Orders: Admin 1st Vaccine ([address]) Flu Vaccine 89yrs + (60454)  Patient Instructions: 1)  Arch supports when ambulating. Flu Vaccine Consent Questions     Do you have a history of severe allergic reactions to this vaccine? no    Any prior history of allergic reactions to egg and/or gelatin? no    Do you have a sensitivity to the preservative Thimersol? no    Do you  have a past history of Guillan-Barre Syndrome? no    Do you currently have an acute febrile illness? no    Have you ever had a severe reaction to latex? no    Vaccine information given and explained to patient? yes    Are you currently pregnant? no    Lot Number:[id number]   Exp Date:05/08/2011   Site Given  Left Deltoid IMPrescriptions: MELOXICAM 7.5 MG TABS (MELOXICAM) 1 two times a day as needed pelvic pain(hold aspirin)  #30 x 0   Entered and Authorized by:   Marga Melnick MD   Signed by:   Marga Melnick MD on 09/01/2010   Method used:  Print then Give to Patient   RxID:   [id number]    Orders Added: 1)  Admin 1st Vaccine [90471] 2)  Flu Vaccine 15yrs + [14782] 3)  Est. Patient Level IV [95621] 4)  T-Pelvis 1or 2 views [72170TC]   .lbflu

## 2010-12-10 NOTE — Assessment & Plan Note (Signed)
Summary: concered about actos/backpain/kn   Vital Signs:  Patient profile:   65 year old male Weight:      212.6 pounds BMI:     31.06 Temp:     98.3 degrees F oral Pulse rate:   56 / minute Resp:     14 per minute BP sitting:   126 / 74  (left arm) Cuff size:   large  Vitals Entered By: Shonna Chock CMA (September 16, 2010 8:06 AM) CC: Back concerns and actos concerns , Lower Extremity Joint pain   CC:  Back concerns and actos concerns  and Lower Extremity Joint pain.  History of Present Illness: Troy House  presents with  pain  in the right foot anterior to heel on the sole of the R foot & L buttocks.  The pain has   improved 20% since August . NSAIDS help more than the Meloxicam.  The pain is described as aching; it is aggravated by walking up hills & square dancing.  The patient denies the following symptoms: fever, rash, eye symptoms, diarrhea, and dysuria. His L inferior buttocks pain has improved 50% since 03/2010. Additionally he & his wife have concerns about bladder cancer risk with Actos.He has not been checking glucoses.  Current Medications (verified): 1)  Allopurinol 300 Mg  Tabs (Allopurinol) .... 1/2 Tab Qd 2)  Ramipril 10 Mg  Caps (Ramipril) .Marland Kitchen.. 1 By Mouth Qd 3)  Actos 15 Mg  Tabs (Pioglitazone Hcl) .Marland Kitchen.. 1 By Mouth Qd 4)  Fenofibrate 160 Mg  Tabs (Fenofibrate) .Marland Kitchen.. 1 By Mouth Qd 5)  Amlodipine Besylate 5 Mg Tabs (Amlodipine Besylate) .Marland Kitchen.. 1 Once Daily 6)  Indomethacin 50 Mg Caps (Indomethacin) .Marland Kitchen.. 1 By Mouth Three Times A Day As Needed 7)  Vitamin D3 1000 Unit Caps (Cholecalciferol) .Marland Kitchen.. 1 By Mouth Once Daily 8)  Epi Pen .... As Needed 9)  Viagra 100 Mg Tabs (Sildenafil Citrate) .... 1/2 -1 Once Daily As Needed 10)  Nascobal 500 Mcg/0.57ml Soln (Cyanocobalamin) .Marland Kitchen.. 1 Spray Weekly 11)  Meloxicam 7.5 Mg Tabs (Meloxicam) .Marland Kitchen.. 1 Two Times A Day As Needed Pelvic Pain(Hold Aspirin)  Allergies: 1)  ! Pcn 2)  ! Hydrocodone  Physical Exam  General:  well-nourished,in no  acute distress; alert,appropriate and cooperative throughout examination Abdomen:  No tenderness @ L inferior buttocks to deep palpation Pulses:  R and L dorsalis pedis and posterior tibial pulses are full and equal bilaterally Extremities:  No clubbing, cyanosis, edema, or deformity noted with normal full range of motion of  ankle  joints.  No sole tenderness Neurologic:  gait normal and DTRs symmetrical and normal.   Skin:  Intact without suspicious lesions or rashes   Impression & Recommendations:  Problem # 1:  HEEL PAIN, RIGHT (ICD-729.5)  Plantar fasciitis variant suggested  Orders: Sports Medicine (Sports Med)  Problem # 2:  PELVIC PAIN, LEFT (ICD-789.09)  The following medications were removed from the medication list:    Aspirin Adult Low Strength 81 Mg Tbec (Aspirin) .Marland KitchenMarland KitchenMarland KitchenMarland Kitchen 3 by mouth once daily His updated medication list for this problem includes:    Indomethacin 50 Mg Caps (Indomethacin) .Marland Kitchen... 1 by mouth three times a day as needed    Meloxicam 7.5 Mg Tabs (Meloxicam) .Marland Kitchen... 1 two times a day as needed pelvic pain(hold aspirin)  Problem # 3:  FASTING HYPERGLYCEMIA (ICD-790.29)  His updated medication list for this problem includes:    Actos 15 Mg Tabs (Pioglitazone hcl) .Marland Kitchen... 1 by mouth once daily. Concerns  about Actos addressed  Complete Medication List: 1)  Allopurinol 300 Mg Tabs (Allopurinol) .... 1/2 tab qd 2)  Ramipril 10 Mg Caps (Ramipril) .Marland Kitchen.. 1 by mouth qd 3)  Fenofibrate 160 Mg Tabs (Fenofibrate) .Marland Kitchen.. 1 by mouth qd 4)  Amlodipine Besylate 5 Mg Tabs (Amlodipine besylate) .Marland Kitchen.. 1 once daily 5)  Indomethacin 50 Mg Caps (Indomethacin) .Marland Kitchen.. 1 by mouth three times a day as needed 6)  Vitamin D3 1000 Unit Caps (Cholecalciferol) .Marland Kitchen.. 1 by mouth once daily 7)  Epi Pen  .... As needed 8)  Viagra 100 Mg Tabs (Sildenafil citrate) .... 1/2 -1 once daily as needed 9)  Nascobal 500 Mcg/0.61ml Soln (Cyanocobalamin) .Marland Kitchen.. 1 spray weekly 10)  Meloxicam 7.5 Mg Tabs (Meloxicam)  .Marland Kitchen.. 1 two times a day as needed pelvic pain(hold aspirin) 11)  Nitroglycerin 0.1 Mg/hr Pt24 (Nitroglycerin) .... Apply to area of pain once daily  Patient Instructions: 1)  Glucosamine 1500 mg once daily for soft tissue hydration Prescriptions: NITROGLYCERIN 0.1 MG/HR PT24 (NITROGLYCERIN) apply to area of pain once daily  #15 x 0   Entered and Authorized by:   Marga Melnick MD   Signed by:   Marga Melnick MD on 09/16/2010   Method used:   Print then Give to Patient   RxID:   [id number]    Orders Added: 1)  Est. Patient Level III [29528] 2)  Sports Medicine [Sports Med]

## 2010-12-10 NOTE — Letter (Signed)
Summary: Colonoscopy Letter  [facility]  [address] [place], [place] [address]   Phone: [phone]  Fax: [phone]      January 23, 2010 MRN: [id number]   ABRHAM MASLOWSKI [address] [place], [place]  [address]   Dear Mr. Aime,   According to your medical record, it is time for you to schedule a Colonoscopy. The American Cancer Society recommends this procedure as a method to detect early colon cancer. Patients with a family history of colon cancer, or a personal history of colon polyps or inflammatory bowel disease are at increased risk.  This letter has been generated based on the recommendations made at the time of your procedure. If you feel that in your particular situation this may no longer apply, please contact our office.  Please call our office at [phone] to schedule this appointment or to update your records at your earliest convenience.  Thank you for cooperating with [place] to provide you with the very best care possible.   Sincerely,  Barbette Hair. Arlyce Dice, M.D.  [facility] [phone]

## 2010-12-10 NOTE — Assessment & Plan Note (Signed)
Summary: review lab/cbs   Vital Signs:  Patient profile:   65 year old male Weight:      204 pounds Pulse rate:   60 / minute Resp:     14 per minute BP sitting:   128 / 84  (left arm) Cuff size:   large  Vitals Entered By: Shonna Chock CMA (June 17, 2010 8:08 AM) CC: Follow-up visit: patient with mailed copy of labs    CC:  Follow-up visit: patient with mailed copy of labs .  History of Present Illness: Labs reviewed  & risks discussed. HCT was 37.2  & B12 356( iron panel & folate were WNL)   in 11/2009; now HCT 35.7 & B12 183. Nascobal  was taken for  only 1 RX. Colonoscopy 01/2010 : tubular adenoma ; repeat in 2016. No GI symptoms(see ROS).                                                                                     Role of HFCS increasing TG discussed.  Current Medications (verified): 1)  Allopurinol 300 Mg  Tabs (Allopurinol) .... 1/2 Tab Qd 2)  Ramipril 10 Mg  Caps (Ramipril) .Marland Kitchen.. 1 By Mouth Qd 3)  Actos 15 Mg  Tabs (Pioglitazone Hcl) .Marland Kitchen.. 1 By Mouth Qd 4)  Fenofibrate 160 Mg  Tabs (Fenofibrate) .Marland Kitchen.. 1 By Mouth Qd 5)  Aspirin Adult Low Strength 81 Mg Tbec (Aspirin) .... 3 By Mouth Once Daily 6)  Amlodipine Besylate 5 Mg Tabs (Amlodipine Besylate) .Marland Kitchen.. 1 Once Daily 7)  Indomethacin 50 Mg Caps (Indomethacin) .Marland Kitchen.. 1 By Mouth Three Times A Day As Needed 8)  Vitamin D3 1000 Unit Caps (Cholecalciferol) .Marland Kitchen.. 1 By Mouth Once Daily 9)  Epi Pen .... As Needed 10)  Viagra 100 Mg Tabs (Sildenafil Citrate) .... 1/2 -1 Once Daily As Needed  Allergies: 1)  ! Pcn 2)  ! Hydrocodone  Review of Systems General:  Denies weight loss. ENT:  Denies difficulty swallowing and hoarseness. GI:  Denies abdominal pain, bloody stools, dark tarry stools, and indigestion.  Physical Exam  General:  well-nourished; alert,appropriate and cooperative throughout examination Lungs:  Normal respiratory effort, chest expands symmetrically. Lungs are clear to auscultation, no crackles or  wheezes. Heart:  regular rhythm, no gallop, no rub, no JVD, no HJR, bradycardia, and grade 1/2 /6 systolic murmur.   Abdomen:  Bowel sounds positive,abdomen soft and non-tender without masses, organomegaly or hernias noted. Pulses:  R and L carotid,radial,dorsalis pedis and posterior tibial pulses are full and equal bilaterally Cervical Nodes:  No lymphadenopathy noted Axillary Nodes:  No palpable lymphadenopathy   Impression & Recommendations:  Problem # 1:  ANEMIA, PERNICIOUS (ICD-281.0)  His updated medication list for this problem includes:    Nascobal 500 Mcg/0.61ml Soln (Cyanocobalamin) .Marland Kitchen... 1 spray weekly  Problem # 2:  HYPERTENSION (ICD-401.9)  His updated medication list for this problem includes:    Ramipril 10 Mg Caps (Ramipril) .Marland Kitchen... 1 by mouth qd    Amlodipine Besylate 5 Mg Tabs (Amlodipine besylate) .Marland Kitchen... 1 once daily  Complete Medication List: 1)  Allopurinol 300 Mg Tabs (Allopurinol) .... 1/2 tab qd 2)  Ramipril 10  Mg Caps (Ramipril) .Marland Kitchen.. 1 by mouth qd 3)  Actos 15 Mg Tabs (Pioglitazone hcl) .Marland Kitchen.. 1 by mouth qd 4)  Fenofibrate 160 Mg Tabs (Fenofibrate) .Marland Kitchen.. 1 by mouth qd 5)  Aspirin Adult Low Strength 81 Mg Tbec (Aspirin) .... 3 by mouth once daily 6)  Amlodipine Besylate 5 Mg Tabs (Amlodipine besylate) .Marland Kitchen.. 1 once daily 7)  Indomethacin 50 Mg Caps (Indomethacin) .Marland Kitchen.. 1 by mouth three times a day as needed 8)  Vitamin D3 1000 Unit Caps (Cholecalciferol) .Marland Kitchen.. 1 by mouth once daily 9)  Epi Pen  .... As needed 10)  Viagra 100 Mg Tabs (Sildenafil citrate) .... 1/2 -1 once daily as needed 11)  Nascobal 500 Mcg/0.85ml Soln (Cyanocobalamin) .Marland Kitchen.. 1 spray weekly  Patient Instructions: 1)  Consume < 40 grams of HFCS  sugar/ day.  Prescriptions: NASCOBAL 500 MCG/0.1ML SOLN (CYANOCOBALAMIN) 1 spray weekly  #1 month x 11   Entered and Authorized by:   Marga Melnick MD   Signed by:   Marga Melnick MD on 06/17/2010   Method used:   Print then Give to Patient   RxID:    [id number]

## 2010-12-10 NOTE — Procedures (Signed)
Summary: Colonoscopy  Patient: [name] Note: All result statuses are Final unless otherwise noted.  Tests: (1) Colonoscopy (COL)   COL Colonoscopy           DONE     [facility]     [address]     [place], [place]  [address]           COLONOSCOPY PROCEDURE REPORT           PATIENT:  Troy, House  MR#:  [id number]     BIRTHDATE:  [birth year], 63 yrs. old  GENDER:  male           ENDOSCOPIST:  Barbette Hair. Arlyce Dice, MD     Referred by:           PROCEDURE DATE:  03/10/2010     PROCEDURE:  Colonoscopy with snare polypectomy     ASA CLASS:  Class II     INDICATIONS:  1) Routine Risk Screening           MEDICATIONS:   Fentanyl 75 mcg IV, Versed 8 mg IV           DESCRIPTION OF PROCEDURE:   After the risks benefits and     alternatives of the procedure were thoroughly explained, informed     consent was obtained.  Digital rectal exam was performed and     revealed no abnormalities.   The LB [id number] endoscope     was introduced through the anus and advanced to the cecum, which     was identified by both the appendix and ileocecal valve, without     limitations.  The quality of the prep was excellent, using     MoviPrep.  The instrument was then slowly withdrawn as the colon     was fully examined.     <<PROCEDUREIMAGES>>           FINDINGS:  A sessile polyp was found in the distal transverse     colon. It was 8 mm in size. Polyp was snared, then cauterized with     monopolar cautery. Retrieval was successful (see image9). snare     polyp  This was otherwise a normal examination of the colon (see     image1, image2, image3, image5, image6, image7, image11, image13,     and image14).   Retroflexed views in the rectum revealed no     abnormalities.    The time to cecum =  2.25  minutes. The scope     was then withdrawn (time =  9.75  min) from the patient and the     procedure completed.           COMPLICATIONS:  None           ENDOSCOPIC IMPRESSION:     1)  8 mm sessile polyp in the distal transverse colon     2) Otherwise normal examination     RECOMMENDATIONS:     1) If the polyp(s) removed today are proven to be adenomatous     (pre-cancerous) polyps, you will need a repeat colonoscopy in 5     years. Otherwise you should continue to follow colorectal cancer     screening guidelines for "routine risk" patients with colonoscopy     in 10 years.           REPEAT EXAM:   You will receive a letter from Dr. Arlyce Dice in 1-2     weeks, after reviewing  the final pathology, with followup     recommendations.           ______________________________     Barbette Hair Arlyce Dice, MD           CC: Pecola Lawless, MD           n.     Rosalie Doctor:   Barbette Hair. Kryslyn Helbig Sunga at 03/10/2010 11:23 AM           Page 2 of 3   Troy, House, [id number]  Note: An exclamation mark (!) indicates a result that was not dispersed into the flowsheet. Document Creation Date: 03/10/2010 11:23 AM _______________________________________________________________________  (1) Order result status: Final Collection or observation date-time: 03/10/2010 11:16 Requested date-time:  Receipt date-time:  Reported date-time:  Referring Physician:   Ordering Physician: Melvia Heaps [id number]) Specimen Source:  Source: Launa Grill Order Number: [id number] Lab site:   Appended Document: Colonoscopy     Procedures Next Due Date:    Colonoscopy: 03/2015

## 2010-12-10 NOTE — Letter (Signed)
Summary: *Consult Note  [facility]  [address]   [place], [place] [address]   Phone: [phone]  Fax: [phone]    Re:    Troy House Narain DOB:    03/30/1946 Marga Melnick, MD Gerarda Fraction 10/08/10  Dear Alfonse Flavors:    Thank you for requesting that we see the above patient for consultation.  A copy of the detailed office note will be sent under separate cover, for your review.  Evaluation today is consistent with:  1)  PLANTAR FASCIITIS, RIGHT (ICD-728.71) 2) Heel pain and os calcis contusion   Our recommendation is for: This was confirmed on [facility] [place].  We advised some standard PF stretches and exercises.  He was already improving with pain based on your treatment.  He will see me again if symptoms are too slow to resolve.   New Orders include:  1)  Consultation Level II [99242] 2)  [place] LIMITED [76882]   New Medications started today include: none   After today's visit, the patients current medications include: 1)  ALLOPURINOL 300 MG  TABS (ALLOPURINOL) 1/2 tab qd 2)  RAMIPRIL 10 MG  CAPS (RAMIPRIL) 1 by mouth qd 3)  FENOFIBRATE 160 MG  TABS (FENOFIBRATE) 1 by mouth qd 4)  AMLODIPINE BESYLATE 5 MG TABS (AMLODIPINE BESYLATE) 1 once daily 5)  INDOMETHACIN 50 MG CAPS (INDOMETHACIN) 1 by mouth three times a day as needed 6)  VITAMIN D3 1000 UNIT CAPS (CHOLECALCIFEROL) 1 by mouth once daily 7)  * EPI PEN as needed 8)  VIAGRA 100 MG TABS (SILDENAFIL CITRATE) 1/2 -1 once daily as needed 9)  NASCOBAL 500 MCG/0.1ML SOLN (CYANOCOBALAMIN) 1 spray weekly 10)  MELOXICAM 7.5 MG TABS (MELOXICAM) 1 two times a day as needed pelvic pain(hold aspirin) 11)  NITROGLYCERIN 0.1 MG/HR PT24 (NITROGLYCERIN) apply to area of pain once daily   Thank you for this consultation.  If you have any further questions regarding the care of this patient, please do not hesitate to contact me @ [phone].  Thank you for this opportunity to look after your  patient.  Sincerely,  Vincent Gros MD

## 2010-12-10 NOTE — Letter (Signed)
Summary: Previsit letter  [facility]  [address] [place], [place] [address]   Phone: [phone]  Fax: [phone]       02/12/2010 MRN: [id number]  Troy House [address] [place], [place]  [address]  Dear [name],  Welcome to the Gastroenterology Division at [facility].    You are scheduled to see a nurse for your pre-procedure visit on 02/25/2010 at 10:00AM on the 3rd floor at [facility], [address]  We ask that you try to arrive at our office 15 minutes prior to your appointment time to allow for check-in.  Your nurse visit will consist of discussing your medical and surgical history, your immediate family medical history, and your medications.    Please bring a complete list of all your medications or, if you prefer, bring the medication bottles and we will list them.  We will need to be aware of both prescribed and over the counter drugs.  We will need to know exact dosage information as well.  If you are on blood thinners (Coumadin, Plavix, Aggrenox, Ticlid, etc.) please call our office today/prior to your appointment, as we need to consult with your physician about holding your medication.   Please be prepared to read and sign documents such as consent forms, a financial agreement, and acknowledgement forms.  If necessary, and with your consent, a friend or relative is welcome to sit-in on the nurse visit with you.  Please bring your insurance card so that we may make a copy of it.  If your insurance requires a referral to see a specialist, please bring your referral form from your primary care physician.  No co-pay is required for this nurse visit.     If you cannot keep your appointment, please call [phone] to cancel or reschedule prior to your appointment date.  This allows [place] the opportunity to schedule an appointment for another patient in need of care.    Thank you for choosing [facility] for your medical needs.  We  appreciate the opportunity to care for you.  Please visit [place] at our website  to learn more about our practice.                     Sincerely.                                                                                                                   The Gastroenterology Division

## 2010-12-16 NOTE — Assessment & Plan Note (Signed)
Summary: complete physical/kn---confirmed///sph  1/30   Vital Signs:  Patient profile:   65 year old male Height:      70.5 inches Weight:      213.2 pounds BMI:     30.27 Temp:     98.2 degrees F oral Pulse rate:   52 / minute Resp:     14 per minute BP sitting:   120 / 80  (left arm) Cuff size:   large  Vitals Entered By: Shonna Chock CMA (December 09, 2010 8:36 AM)    History of Present Illness:    Troy House is here for a physical; he is concerned about anemia which has precluded donating blood for 2 years. The patient reports weight gain of 7-10# in past year, but denies acid reflux, sour taste in mouth, epigastric pain, chest pain, and trouble swallowing.  The patient denies the following alarm features: melena, dysphagia, hematemesis, and vomiting. No history of bleeding dyscrasias.     He has had fasting hyperglycemia; he is on low dose Actos. The patient denies polyuria, polydipsia, blurred vision, self managed hypoglycemia, and numbness of extremities.  The patient denies the following symptoms: neuropathic pain, orthostatic symptoms, poor wound healing, intermittent claudication, vision loss, and foot ulcer.  Since the last visit the patient reports good dietary compliance, exercising regularly ( 4 hrs of square dancing/ week, gym, Tai Chi). He is  not monitoring blood glucose.  Since the last visit, the patient reports having had no eye care.    Lipid Management History:      Positive NCEP/ATP III risk factors include male age 48 years old or older, family history for ischemic heart disease (males less than 54 years old), and hypertension.  Negative NCEP/ATP III risk factors include non-diabetic, HDL cholesterol greater than 60, non-tobacco-user status, no ASHD (atherosclerotic heart disease), no prior stroke/TIA, no peripheral vascular disease, and no history of aortic aneurysm.     Preventive Screening-Counseling & Management  Caffeine-Diet-Exercise     Does Patient  Exercise: yes  Current Medications (verified): 1)  Allopurinol 300 Mg  Tabs (Allopurinol) .... 1/2 Tab Qd 2)  Ramipril 10 Mg  Caps (Ramipril) .Marland Kitchen.. 1 By Mouth Qd 3)  Fenofibrate 160 Mg  Tabs (Fenofibrate) .Marland Kitchen.. 1 By Mouth Qd 4)  Amlodipine Besylate 5 Mg Tabs (Amlodipine Besylate) .Marland Kitchen.. 1 Once Daily 5)  Indomethacin 50 Mg Caps (Indomethacin) .Marland Kitchen.. 1 By Mouth Three Times A Day As Needed 6)  Epi Pen .... As Needed 7)  Viagra 100 Mg Tabs (Sildenafil Citrate) .... 1/2 -1 Once Daily As Needed 8)  Nascobal 500 Mcg/0.37ml Soln (Cyanocobalamin) .Marland Kitchen.. 1 Spray Weekly 9)  Meloxicam 7.5 Mg Tabs (Meloxicam) .Marland Kitchen.. 1 Two Times A Day As Needed Pelvic Pain(Hold Aspirin) 10)  Fish Oil 1000 Mg Caps (Omega-3 Fatty Acids) .Marland Kitchen.. 1 By Mouth Once Daily 11)  Actos 15 Mg Tabs (Pioglitazone Hcl) .Marland Kitchen.. 1 By Mouth Once Daily  Allergies: 1)  ! Pcn 2)  ! Hydrocodone  Past History:  Past Medical History: Gout, PMH of ( last flare 2006) Borderline anemia, PMH of  2000 (B12,folate,iron panel WNL) ; low B12 (205) in 2010; Blood Donor, PMH of  Hypertension Hyperlipidemia: Framingham Study LDL goal = < 130. FH of premature MI in father  in 87s. Hyperglycemia, fasting , PMH of Benign prostatic hypertrophy Colonic polyps, PMH  of 2011  Past Surgical History: Vasectomy Elbow surgery bilaterally for osteophytes Lump removed L  breast ( benign) Arthroscopy L  knee Lasik surgery  OD X 2 Surgery for frozen  L shoulder  Colonoscopy negative  2001; Endoscopy  2001 negative to evaluate anemia, Dr Arlyce Dice Colon polypectomy 03/2010(TUBULAR ADENOMA)  Family History:  MGF: dementia, cancer ? primary Father: HTN,CVA @ 56,MI in 1s (3 ppd smoker) Mother: adrenal insufficiency ?due to steroids for ?,HTN Siblings: sister: ? Lupus Variant; sister: DM,resolved post Bariatric Surgery; Niece: Bipolar disorder; Nephew: ADHD; Niece: ADHD; sister :?thyroid malignancy 08/2008,S/P resection & radiation  Social History: Former Smoker: quit  1974 Alcohol use-yes: socially Occupation: Lawyer;  own Internet business Married Regular exercise-yes Does Patient Exercise:  yes  Review of Systems  The patient denies anorexia, fever, hoarseness, syncope, suspicious skin lesions, depression, unusual weight change, enlarged lymph nodes, and angioedema.         RLE swelling intermittently ENT:  Denies nosebleeds. Resp:  Denies cough, coughing up blood, and sputum productive. GU:  Denies discharge, dysuria, and hematuria. MS:  Denies joint pain, joint redness, and joint swelling; R Plantar Fasciitis treated by Dr Roanna Epley. Minor finger arthralgia.. Heme:  Denies abnormal bruising and bleeding.  Physical Exam  General:  well-nourished,in no acute distress; alert,appropriate and cooperative throughout examination Head:  Normocephalic and atraumatic without obvious abnormalities.  Pattern alopecia  Eyes:  No corneal or conjunctival inflammation noted. EOMI. Perrla. Funduscopic exam benign, without hemorrhages, exudates or papilledema. Vision grossly normal. Ears:  External ear exam shows no significant lesions or deformities.  Otoscopic examination reveals clear canals, tympanic membranes are intact bilaterally without bulging, retraction, inflammation or discharge. Hearing is grossly normal bilaterally. Some wax , R > L Nose:  External nasal examination shows no deformity or inflammation. Nasal mucosa are pink and moist without lesions or exudates. Mouth:  Oral mucosa and oropharynx without lesions or exudates.  Teeth in good repair. Neck:  No deformities, masses, or tenderness noted. Lungs:  Normal respiratory effort, chest expands symmetrically. Lungs are clear to auscultation, no crackles or wheezes. Heart:  regular rhythm, no gallop, no rub, no JVD, no HJR, bradycardia, and grade 1 /6 systolic murmur.   Abdomen:  Bowel sounds positive,abdomen soft and non-tender without masses, organomegaly or hernias noted. Rectal:  No  external abnormalities noted. Normal sphincter tone. No rectal masses or tenderness. Genitalia:  Testes bilaterally descended without nodularity, tenderness or masses. No scrotal masses or lesions. No penis lesions or urethral discharge. Vasectomy scar tissue present on L Prostate:  1+ enlarged;R lobe firm w/o nodules.   Msk:  No deformity or scoliosis noted of thoracic or lumbar spine.   Pulses:  R and L carotid,radial,dorsalis pedis and posterior tibial pulses are full and equal bilaterally Extremities:  No clubbing, cyanosis or deformity noted with normal full range of motion of all joints.  1/2 + edema R & LLE Neurologic:  alert & oriented X3, sensation intact to light touch over feet, and DTRs symmetrical and normal.   Skin:  Intact without suspicious lesions or rashes. Tatoo LUE Cervical Nodes:  No lymphadenopathy noted Axillary Nodes:  No palpable lymphadenopathy Inguinal Nodes:  No significant adenopathy Psych:  memory intact for recent and remote, normally interactive, and good eye contact.     Impression & Recommendations:  Problem # 1:  ROUTINE GENERAL MEDICAL EXAM@[email or web address]  CARE FACL (ICD-V70.0)  Orders: EKG w/ Interpretation ([address]) Venipuncture ([address]) TLB-Lipid Panel (80061-LIPID) TLB-BMP (Basic Metabolic Panel-BMET) (80048-METABOL) TLB-CBC Platelet - w/Differential (85025-CBCD) TLB-Hepatic/Liver Function Pnl (80076-HEPATIC) TLB-TSH (Thyroid Stimulating Hormone) (84443-TSH) TLB-Uric Acid, Blood (84550-URIC) TLB-PSA (Prostate Specific Antigen) (84153-PSA) TLB-A1C / Hgb A1C (Glycohemoglobin) (  [address]-U0A) TLB-Microalbumin/Creat Ratio, Urine (82043-MALB) TLB-B12 + Folate Pnl (54098_11914-N82/NFA) TLB-IBC Pnl (Iron/FE;Transferrin) (83550-IBC)  Problem # 2:  ANEMIA, PERNICIOUS (ICD-281.0)  His updated medication list for this problem includes:    Nascobal 500 Mcg/0.65ml Soln (Cyanocobalamin) .Marland Kitchen... 1 spray weekly  Problem # 3:  FASTING HYPERGLYCEMIA (ICD-790.29)  His  updated medication list for this problem includes:    Actos 15 Mg Tabs (Pioglitazone hcl) .Marland Kitchen... 1 by mouth once daily  Problem # 4:  HYPERTENSION (ICD-401.9) controlled but edema present, ? from Amlodipine The following medications were removed from the medication list:    Amlodipine Besylate 5 Mg Tabs (Amlodipine besylate) .Marland Kitchen... 1 once daily His updated medication list for this problem includes:    Ramipril 10 Mg Caps (Ramipril) .Marland Kitchen... 1 by mouth qd    Diltiazem Hcl 60 Mg Tabs (Diltiazem hcl) .Marland Kitchen... 1 two times a day in place of amlodipine  Problem # 5:  HYPERLIPIDEMIA (ICD-272.4)  His updated medication list for this problem includes:    Fenofibrate 160 Mg Tabs (Fenofibrate) .Marland Kitchen... 1 by mouth qd  Problem # 6:  GOUT (ICD-274.9) PMH of  His updated medication list for this problem includes:    Allopurinol 300 Mg Tabs (Allopurinol) .Marland Kitchen... 1/2 tab qd  Complete Medication List: 1)  Allopurinol 300 Mg Tabs (Allopurinol) .... 1/2 tab qd 2)  Ramipril 10 Mg Caps (Ramipril) .Marland Kitchen.. 1 by mouth qd 3)  Fenofibrate 160 Mg Tabs (Fenofibrate) .Marland Kitchen.. 1 by mouth qd 4)  Indomethacin 50 Mg Caps (Indomethacin) .Marland Kitchen.. 1 by mouth three times a day as needed 5)  Epi Pen  .... As needed 6)  Viagra 100 Mg Tabs (Sildenafil citrate) .... 1/2 -1 once daily as needed 7)  Nascobal 500 Mcg/0.16ml Soln (Cyanocobalamin) .Marland Kitchen.. 1 spray weekly 8)  Meloxicam 7.5 Mg Tabs (Meloxicam) .Marland Kitchen.. 1 two times a day as needed pelvic pain(hold aspirin) 9)  Fish Oil 1000 Mg Caps (Omega-3 fatty acids) .Marland Kitchen.. 1 by mouth once daily 10)  Actos 15 Mg Tabs (Pioglitazone hcl) .Marland Kitchen.. 1 by mouth once daily 11)  Diltiazem Hcl 60 Mg Tabs (Diltiazem hcl) .Marland Kitchen.. 1 two times a day in place of amlodipine  Lipid Assessment/Plan:      Based on NCEP/ATP III, the patient's risk factor category is "0-1 risk factors".  The patient's lipid goals are as follows: Total cholesterol goal is 200; LDL cholesterol goal is 130; HDL cholesterol goal is 40; Triglyceride goal is  150.  His LDL cholesterol goal has not been met.  Secondary causes for hyperlipidemia have been ruled out.  He has been counseled on adjunctive measures for lowering his cholesterol.    Patient Instructions: 1)  Check your Blood Pressure regularly. If it is above:135/85 ON  AVERAGE  you should make an appointment.Monitor edema & pulse rate after changing Amlodipine to  Diltiazem. Prescriptions: DILTIAZEM HCL 60 MG TABS (DILTIAZEM HCL) 1 two times a day in place of Amlodipine  #60 x 11   Entered and Authorized by:   Marga Melnick MD   Signed by:   Marga Melnick MD on 12/09/2010   Method used:   Print then Give to Patient   RxID:   [id number]    Orders Added: 1)  Est. Patient 40-64 years [99[address]] 2)  EKG w/ Interpretation [93[address]] 3)  Venipuncture [36415] 4)  TLB-Lipid Panel [80061-LIPID] 5)  TLB-BMP (Basic Metabolic Panel-BMET) [80048-METABOL] 6)  TLB-CBC Platelet - w/Differential [85025-CBCD] 7)  TLB-Hepatic/Liver Function Pnl [80076-HEPATIC] 8)  TLB-TSH (Thyroid Stimulating Hormone) [84443-TSH] 9)  TLB-Uric Acid, Blood [84550-URIC] 10)  TLB-PSA (Prostate Specific Antigen) [84153-PSA] 11)  TLB-A1C / Hgb A1C (Glycohemoglobin) [83036-A1C] 12)  TLB-Microalbumin/Creat Ratio, Urine [82043-MALB] 13)  TLB-B12 + Folate Pnl [82746_82607-B12/FOL] 14)  TLB-IBC Pnl (Iron/FE;Transferrin) [83550-IBC]

## 2011-03-11 ENCOUNTER — Other Ambulatory Visit: Payer: Self-pay

## 2011-03-15 ENCOUNTER — Other Ambulatory Visit: Payer: Self-pay | Admitting: *Deleted

## 2011-03-16 MED ORDER — DILTIAZEM HCL 60 MG PO TABS: 60.0000 mg | ORAL_TABLET | Freq: Two times a day (BID) | ORAL | Status: DC

## 2011-06-10 ENCOUNTER — Other Ambulatory Visit: Payer: Self-pay | Admitting: Internal Medicine

## 2011-06-11 ENCOUNTER — Other Ambulatory Visit: Payer: Self-pay | Admitting: Internal Medicine

## 2011-06-16 ENCOUNTER — Encounter: Payer: Self-pay | Admitting: Internal Medicine

## 2011-07-27 ENCOUNTER — Other Ambulatory Visit: Payer: Self-pay | Admitting: Internal Medicine

## 2011-07-27 MED ORDER — RAMIPRIL 10 MG PO CAPS
10.0000 mg | ORAL_CAPSULE | Freq: Every day | ORAL | Status: DC
Start: 2011-07-27 — End: 2011-12-27

## 2011-07-27 NOTE — Telephone Encounter (Signed)
rx sent

## 2011-07-27 NOTE — Telephone Encounter (Signed)
Patient changed pharmacy - refill for rampril 10 mg - 90 day supply - [facility] - [address]

## 2011-07-30 ENCOUNTER — Other Ambulatory Visit: Payer: Self-pay | Admitting: Internal Medicine

## 2011-07-30 MED ORDER — FENOFIBRATE 160 MG PO TABS: 160.0000 mg | ORAL_TABLET | Freq: Every day | ORAL | Status: DC

## 2011-07-30 NOTE — Telephone Encounter (Signed)
RX sent

## 2011-08-16 ENCOUNTER — Other Ambulatory Visit: Payer: Self-pay | Admitting: Internal Medicine

## 2011-08-17 MED ORDER — FENOFIBRATE 160 MG PO TABS: 160.0000 mg | ORAL_TABLET | Freq: Every day | ORAL | Status: DC

## 2011-08-17 NOTE — Telephone Encounter (Signed)
RX sent

## 2011-11-09 HISTORY — PX: SHOULDER SURGERY: SHX246

## 2011-12-13 ENCOUNTER — Encounter: Payer: Managed Care, Other (non HMO) | Admitting: Internal Medicine

## 2011-12-14 ENCOUNTER — Ambulatory Visit (INDEPENDENT_AMBULATORY_CARE_PROVIDER_SITE_OTHER): Payer: Managed Care, Other (non HMO) | Admitting: Internal Medicine

## 2011-12-14 ENCOUNTER — Encounter: Payer: Self-pay | Admitting: Internal Medicine

## 2011-12-14 DIAGNOSIS — R7309 Other abnormal glucose: Secondary | ICD-10-CM

## 2011-12-14 DIAGNOSIS — I10 Essential (primary) hypertension: Secondary | ICD-10-CM

## 2011-12-14 DIAGNOSIS — D510 Vitamin B12 deficiency anemia due to intrinsic factor deficiency: Secondary | ICD-10-CM

## 2011-12-14 DIAGNOSIS — E785 Hyperlipidemia, unspecified: Secondary | ICD-10-CM

## 2011-12-14 DIAGNOSIS — M109 Gout, unspecified: Secondary | ICD-10-CM

## 2011-12-14 DIAGNOSIS — N400 Enlarged prostate without lower urinary tract symptoms: Secondary | ICD-10-CM

## 2011-12-14 MED ORDER — SILDENAFIL CITRATE 100 MG PO TABS: ORAL_TABLET | ORAL | Status: DC

## 2011-12-14 MED ORDER — FENOFIBRATE 160 MG PO TABS: 160.0000 mg | ORAL_TABLET | Freq: Every day | ORAL | Status: DC

## 2011-12-14 NOTE — Assessment & Plan Note (Signed)
As noted he has been turned down as a blood donor at the [facility] due to anemia. Labs need to assess status

## 2011-12-14 NOTE — Assessment & Plan Note (Signed)
Blood pressure is controlled today; goals discussed.

## 2011-12-14 NOTE — Patient Instructions (Signed)
Preventive Health Care: Exercise at least 30-45 minutes a day,  3-4 days a week.  Eat a low-fat diet with lots of fruits and vegetables, up to 7-9 servings per day. Consume less than 40 grams of sugar per day from foods & drinks with High Fructose Corn Sugar as # 1,2,3 or # 4 on label. Alcohol If you drink, do it moderately,less than 9 drinks per week, preferably less than 6 @ most. Please  schedule fasting Labs : BMET,Lipids, hepatic panel, CBC & dif, TSH,A1c, urine microalbumin, uric acid. PLEASE BRING THESE INSTRUCTIONS TO FOLLOW UP  LAB APPOINTMENT.This will guarantee correct labs are drawn, eliminating need for repeat blood sampling ( needle sticks ! ). Diagnoses /Codes: V70.0

## 2011-12-14 NOTE — Assessment & Plan Note (Signed)
A1 C  needed to assess long-term diabetic risk

## 2011-12-14 NOTE — Assessment & Plan Note (Signed)
Major issue has been elevated triglycerides; LDL has been near goal.

## 2011-12-14 NOTE — Progress Notes (Signed)
Subjective:    Patient ID: Troy House, male    DOB: Jul 04, 1946, 66 y.o.   MRN: [id number]  HPI  Troy House is here for a physical;acute issues include abnormal Hgb/Hct @ [facility] & also R shoulder bursitis.     Review of Systems HYPERTENSION: Disease Monitoring: Blood pressure range-not checked  Chest pain, palpitations- no       Dyspnea- no Medications: Compliance- yes Lightheadedness,Syncope- no    Edema- tace @ sock line  FASTING HYPERGLYCEMIA: Disease Monitoring: Blood Sugar ranges-not checked  Polyuria/phagia/dipsia- no       Visual problems- some decreased vision ; ptosis affects vision Medications: Compliance- no DM meds  Hypoglycemic symptoms- no  HYPERLIPIDEMIA: Disease Monitoring: See symptoms for Hypertension Medications: Compliance- yes  Abd pain, bowel changes- no Muscle aches- no         Objective:   Physical Exam Gen.: Healthy and well-nourished in appearance. Alert, appropriate and cooperative throughout exam. Head: Normocephalic without obvious abnormalities; pattern alopecia  Eyes: No corneal or conjunctival inflammation noted. Pupils equal round reactive to light and accommodation. Fundal exam is benign without hemorrhages, exudate, papilledema. Extraocular motion intact. Vision grossly normal.Ptosis bilaterally Ears: External  ear exam reveals no significant lesions or deformities. Canals ; wax on R . Left TM normal. Hearing is grossly normal bilaterally. Nose: External nasal exam reveals no deformity or inflammation. Nasal mucosa are pink and moist. No lesions or exudates noted.  Mouth: Oral mucosa and oropharynx reveal no lesions or exudates. Teeth in good repair. Neck: No deformities, masses, or tenderness noted. Range of motion & Thyroid normal. Lungs: Normal respiratory effort; chest expands symmetrically. Lungs are clear to auscultation without rales, wheezes, or increased work of breathing. Chest: Pectus excavatum Heart: Normal rate and  rhythm. Normal S1 and S2. No gallop, click, or rub. Grade 1/6 systolic murmur  Abdomen: Bowel sounds normal; abdomen soft and nontender. No masses, organomegaly or hernias noted. Genitalia/DRE: There is a large granuloma in the left scrotum. The prostate is mildly enlarged without significant asymmetry, induration, or nodularity.    .                                                                                   Musculoskeletal/extremities: No deformity or scoliosis noted of  the thoracic or lumbar spine. No clubbing, cyanosis or deformity noted. Range of motion  normal .Tone & strength  normal.Joints normal. Nail health  Good. Trace edema @ socks' line Vascular: Carotid, radial artery, dorsalis pedis and  posterior tibial pulses are full and equal. No bruits present. Neurologic: Alert and oriented x3. Deep tendon reflexes symmetrical and normal.          Skin: Intact without suspicious lesions or rashes.tatoo LUE Lymph: No cervical, axillary, or inguinal lymphadenopathy present. Psych: Mood and affect are normal. Normally interactive  Assessment & Plan:  #1 comprehensive physical exam; no acute findings #2 see Problem List with Assessments & Recommendations Plan: see Orders

## 2011-12-15 ENCOUNTER — Other Ambulatory Visit: Payer: Self-pay | Admitting: Internal Medicine

## 2011-12-15 DIAGNOSIS — Z0000 Encounter for general adult medical examination without abnormal findings: Secondary | ICD-10-CM

## 2011-12-16 ENCOUNTER — Other Ambulatory Visit (INDEPENDENT_AMBULATORY_CARE_PROVIDER_SITE_OTHER): Payer: Managed Care, Other (non HMO)

## 2011-12-16 DIAGNOSIS — Z0000 Encounter for general adult medical examination without abnormal findings: Secondary | ICD-10-CM

## 2011-12-16 LAB — CBC WITH DIFFERENTIAL/PLATELET
Basophils Absolute: 0 10*3/uL (ref 0.0–0.1)
Basophils Relative: 0.3 % (ref 0.0–3.0)
Eosinophils Absolute: 0.1 10*3/uL (ref 0.0–0.7)
HCT: 36.8 % — ABNORMAL LOW (ref 39.0–52.0)
Hemoglobin: 12.5 g/dL — ABNORMAL LOW (ref 13.0–17.0)
Lymphs Abs: 1.9 10*3/uL (ref 0.7–4.0)
MCHC: 34 g/dL (ref 30.0–36.0)
Neutro Abs: 3 10*3/uL (ref 1.4–7.7)
RBC: 3.87 Mil/uL — ABNORMAL LOW (ref 4.22–5.81)
RDW: 13.5 % (ref 11.5–14.6)

## 2011-12-16 LAB — LIPID PANEL
HDL: 60.6 mg/dL (ref >39.00)
Triglycerides: 209 mg/dL — ABNORMAL HIGH (ref 0.0–149.0)
VLDL: 41.8 mg/dL — ABNORMAL HIGH (ref 0.0–40.0)

## 2011-12-16 LAB — BASIC METABOLIC PANEL
CO2: 28 mEq/L (ref 19–32)
Calcium: 9.5 mg/dL (ref 8.4–10.5)
GFR: 60.64 mL/min (ref >60.00)
Glucose, Bld: 110 mg/dL — ABNORMAL HIGH (ref 70–99)
Potassium: 4 mEq/L (ref 3.5–5.1)
Sodium: 138 mEq/L (ref 135–145)

## 2011-12-16 LAB — HEPATIC FUNCTION PANEL
Alkaline Phosphatase: 38 U/L — ABNORMAL LOW (ref 39–117)
Bilirubin, Direct: 0 mg/dL (ref 0.0–0.3)
Total Bilirubin: 0.6 mg/dL (ref 0.3–1.2)

## 2011-12-16 LAB — MICROALBUMIN / CREATININE URINE RATIO
Creatinine,U: 129.2 mg/dL
Microalb, Ur: 1.1 mg/dL (ref 0.0–1.9)

## 2011-12-16 LAB — URIC ACID: Uric Acid, Serum: 5.6 mg/dL (ref 4.0–7.8)

## 2011-12-16 LAB — LDL CHOLESTEROL, DIRECT: Direct LDL: 107.2 mg/dL

## 2011-12-27 ENCOUNTER — Other Ambulatory Visit: Payer: Self-pay | Admitting: Internal Medicine

## 2012-03-22 ENCOUNTER — Other Ambulatory Visit: Payer: Self-pay | Admitting: Internal Medicine

## 2012-06-17 ENCOUNTER — Other Ambulatory Visit: Payer: Self-pay | Admitting: Internal Medicine

## 2012-06-22 ENCOUNTER — Other Ambulatory Visit: Payer: Self-pay | Admitting: Internal Medicine

## 2012-06-30 NOTE — Progress Notes (Signed)
This encounter was created in error - please disregard.

## 2012-07-12 ENCOUNTER — Other Ambulatory Visit: Payer: Self-pay | Admitting: Internal Medicine

## 2012-09-14 DIAGNOSIS — Z23 Encounter for immunization: Secondary | ICD-10-CM | POA: Diagnosis not present

## 2012-09-15 ENCOUNTER — Other Ambulatory Visit: Payer: Self-pay | Admitting: Internal Medicine

## 2012-09-15 NOTE — Telephone Encounter (Signed)
Rx sent.    MW 

## 2012-10-09 ENCOUNTER — Other Ambulatory Visit: Payer: Self-pay | Admitting: Internal Medicine

## 2012-10-09 NOTE — Telephone Encounter (Signed)
b12 level, DX b12 anemia def

## 2012-10-30 ENCOUNTER — Telehealth: Payer: Self-pay | Admitting: Internal Medicine

## 2012-10-30 NOTE — Telephone Encounter (Signed)
Patient aware we do not have co-pay cards, once we receive cards I will mail to patient

## 2012-10-30 NOTE — Telephone Encounter (Signed)
Spoke with patient, other option is b12 injection. Patient aware I can mail savings card and he can see if that changes the price to a more affordable price, if not patient to call for nurse visit appointment

## 2012-10-30 NOTE — Telephone Encounter (Signed)
Patient cannot afford Nascobal at $400. Is there a substitute?

## 2012-11-30 DIAGNOSIS — M25579 Pain in unspecified ankle and joints of unspecified foot: Secondary | ICD-10-CM | POA: Diagnosis not present

## 2012-11-30 DIAGNOSIS — M542 Cervicalgia: Secondary | ICD-10-CM | POA: Diagnosis not present

## 2012-12-11 ENCOUNTER — Other Ambulatory Visit: Payer: Self-pay | Admitting: Internal Medicine

## 2012-12-12 DIAGNOSIS — D239 Other benign neoplasm of skin, unspecified: Secondary | ICD-10-CM | POA: Diagnosis not present

## 2012-12-12 DIAGNOSIS — D235 Other benign neoplasm of skin of trunk: Secondary | ICD-10-CM | POA: Diagnosis not present

## 2012-12-12 DIAGNOSIS — B354 Tinea corporis: Secondary | ICD-10-CM | POA: Diagnosis not present

## 2012-12-12 DIAGNOSIS — D485 Neoplasm of uncertain behavior of skin: Secondary | ICD-10-CM | POA: Diagnosis not present

## 2013-01-07 ENCOUNTER — Other Ambulatory Visit: Payer: Self-pay | Admitting: Internal Medicine

## 2013-01-25 ENCOUNTER — Ambulatory Visit (INDEPENDENT_AMBULATORY_CARE_PROVIDER_SITE_OTHER): Payer: Medicare Other | Admitting: Internal Medicine

## 2013-01-25 ENCOUNTER — Encounter: Payer: Self-pay | Admitting: Internal Medicine

## 2013-01-25 ENCOUNTER — Other Ambulatory Visit: Payer: Self-pay | Admitting: Internal Medicine

## 2013-01-25 VITALS — BP 138/88 | HR 62 | Resp 14 | Ht 70.03 in | Wt 209.0 lb

## 2013-01-25 DIAGNOSIS — D510 Vitamin B12 deficiency anemia due to intrinsic factor deficiency: Secondary | ICD-10-CM

## 2013-01-25 DIAGNOSIS — Z86010 Personal history of colonic polyps: Secondary | ICD-10-CM

## 2013-01-25 DIAGNOSIS — R7309 Other abnormal glucose: Secondary | ICD-10-CM | POA: Diagnosis not present

## 2013-01-25 DIAGNOSIS — I10 Essential (primary) hypertension: Secondary | ICD-10-CM | POA: Diagnosis not present

## 2013-01-25 DIAGNOSIS — Z0000 Encounter for general adult medical examination without abnormal findings: Secondary | ICD-10-CM

## 2013-01-25 DIAGNOSIS — Z139 Encounter for screening, unspecified: Secondary | ICD-10-CM

## 2013-01-25 DIAGNOSIS — E785 Hyperlipidemia, unspecified: Secondary | ICD-10-CM | POA: Diagnosis not present

## 2013-01-25 DIAGNOSIS — M109 Gout, unspecified: Secondary | ICD-10-CM | POA: Diagnosis not present

## 2013-01-25 LAB — CBC WITH DIFFERENTIAL/PLATELET
Basophils Absolute: 0 10*3/uL (ref 0.0–0.1)
Eosinophils Relative: 0.8 % (ref 0.0–5.0)
HCT: 38.4 % — ABNORMAL LOW (ref 39.0–52.0)
Hemoglobin: 12.9 g/dL — ABNORMAL LOW (ref 13.0–17.0)
Lymphs Abs: 2.1 10*3/uL (ref 0.7–4.0)
MCV: 91.9 fl (ref 78.0–100.0)
Monocytes Absolute: 0.5 10*3/uL (ref 0.1–1.0)
Neutro Abs: 3.2 10*3/uL (ref 1.4–7.7)
Platelets: 247 10*3/uL (ref 150.0–400.0)
RDW: 13.7 % (ref 11.5–14.6)

## 2013-01-25 LAB — URIC ACID: Uric Acid, Serum: 5.5 mg/dL (ref 4.0–7.8)

## 2013-01-25 LAB — LIPID PANEL
HDL: 54.8 mg/dL (ref >39.00)
Total CHOL/HDL Ratio: 4
Triglycerides: 247 mg/dL — ABNORMAL HIGH (ref 0.0–149.0)

## 2013-01-25 LAB — BASIC METABOLIC PANEL
BUN: 12 mg/dL (ref 6–23)
CO2: 28 mEq/L (ref 19–32)
Chloride: 104 mEq/L (ref 96–112)
Glucose, Bld: 130 mg/dL — ABNORMAL HIGH (ref 70–99)
Potassium: 4.1 mEq/L (ref 3.5–5.1)

## 2013-01-25 LAB — HEPATIC FUNCTION PANEL
ALT: 25 U/L (ref 0–53)
Albumin: 4.6 g/dL (ref 3.5–5.2)
Total Bilirubin: 0.6 mg/dL (ref 0.3–1.2)

## 2013-01-25 LAB — TSH: TSH: 0.94 u[IU]/mL (ref 0.35–5.50)

## 2013-01-25 MED ORDER — SILDENAFIL CITRATE 100 MG PO TABS: ORAL_TABLET | ORAL | Status: DC

## 2013-01-25 NOTE — Patient Instructions (Addendum)
If you activate the  My Chart system; lab & Xray results will be released directly  to you as soon as I review & address these through the computer. If you choose not to sign up for My Chart within 36 hours of labs being drawn; results will be reviewed & interpretation added before being copied & mailed, causing a delay in getting the results to you.If you do not receive that report within 7-10 days ,please call. Additionally you can use this system to gain direct  access to your records  if  out of town or @ an office of a  physician who is not in  the My Chart network.  This improves continuity of care & places you in control of your medical record. Review and correct the record as indicated. Please share record with all medical staff seen. 

## 2013-01-25 NOTE — Progress Notes (Signed)
Subjective:    Patient ID: Troy House, male    DOB: 1946-05-03, 67 y.o.   MRN: [id number]  HPI Medicare Wellness Visit:  Psychosocial & medical history were reviewed as required by Medicare (abuse,antisocial behavioral risks,firearm risk).  Social history: caffeine: 1 cup coffee/ week  , alcohol: 14-35 beers/ week  ,  tobacco use:   Quit 1974 Exercise : square dancing 2 X/ week & walking 2 mpd weekly No home & personal  safety / fall risk Activities of daily living: no limitations  Seatbelt  and smoke alarm employed. Power of Attorney/Living Will status : in place Ophthalmology exam not current Hearing evaluation not current Orientation :oriented X 3  Memory & recall :good Math testing:good Mood & affect : normal . Depression / anxiety: denied Travel history : last 2010 [place]  Immunization status : Shingles needed. Flu/ PNA/ tetanus current Transfusion history:  none  Preventive health surveillance ( colonoscopy as per protocol/ [facility]):  colonoscopy due 2016 Dental care:  Every 6 mos. Chart reviewed &  Updated. Active issues reviewed & addressed.      Review of Systems HYPERTENSION: Disease Monitoring: Blood pressure range 115-140/70-85 Compliant with present antihypertensive regimen   PMH of FASTING HYPERGLYCEMIA : Disease Monitoring: Blood Sugar:  no monitor  Medication Compliance: no diabetic meds Diet: modified low carb  HYPERLIPIDEMIA: Diet & exercise as noted Medication Compliance: no  Statin taken FH premature CAD in father   @ 55 Past medical history/family history/social history were all reviewed and updated.    No chest pain, palpitations , claudications, PNDyspnea    No exertional dyspnea No lightheadedness or syncope   No edema No polyuria/phagia/dipsia No limb numbness & tingling or weakness No non healing skin lesions      No blurred , double or loss of vision No abd pain, bowel changes No significant myalgias                                                           Objective:   Physical Exam Gen.: Healthy and well-nourished in appearance. Alert, appropriate and cooperative throughout exam. Appears younger than stated age  Head: Normocephalic without obvious abnormalities; pattern alopecia  Eyes: No corneal or conjunctival inflammation noted.  Extraocular motion intact. Near & distant monocular vision. Ears: External  ear exam reveals no significant lesions or deformities. Canals clear .TMs normal. Hearing is grossly normal bilaterally. Nose: External nasal exam reveals no deformity or inflammation. Nasal mucosa are pink and moist. No lesions or exudates noted.   Mouth: Oral mucosa and oropharynx reveal no lesions or exudates. Teeth in good repair. Neck: No deformities, masses, or tenderness noted. Range of motion decreased to L. Thyroid normal. Lungs: Normal respiratory effort; chest expands symmetrically. Lungs are clear to auscultation without rales, wheezes, or increased work of breathing. Heart: Normal  Rhythm; slow rate. Normal S1 and S2. No gallop, click, or rub. No murmur. Abdomen: Bowel sounds normal; abdomen soft and nontender. No masses, organomegaly or hernias noted. Genitalia: Genitalia normal except for left varices. Prostate is mildly asymmetrically ; R lobe > L. No nodularity or induration                           Musculoskeletal/extremities: There is some asymmetry of the  posterior thoracic musculature suggesting occult scoliosis. No clubbing, cyanosis, edema, or significant extremity  deformity noted. Range of motion normal .Tone & strength  Normal. Joints normal . Nail health good. Able to lie down & sit up w/o help. Negative SLR bilaterally Vascular: Carotid, radial artery, dorsalis pedis and  posterior tibial pulses are full and equal. No bruits present. Neurologic: Alert and oriented x3. Deep tendon reflexes symmetrical and normal.         Skin: Intact without  suspicious lesions or rashes. Lymph: No cervical, axillary, or inguinal lymphadenopathy present.[place] LUE Psych: Mood and affect are normal. Normally interactive                                                                                      Assessment & Plan:  #1 Medicare Wellness Exam; criteria met ; data entered #2 Problem List reviewed ; Assessment/ Recommendations made Plan: see Orders

## 2013-01-26 ENCOUNTER — Other Ambulatory Visit (INDEPENDENT_AMBULATORY_CARE_PROVIDER_SITE_OTHER): Payer: Medicare Other

## 2013-01-26 ENCOUNTER — Ambulatory Visit: Payer: Medicare Other

## 2013-01-26 DIAGNOSIS — R7309 Other abnormal glucose: Secondary | ICD-10-CM

## 2013-01-26 DIAGNOSIS — D510 Vitamin B12 deficiency anemia due to intrinsic factor deficiency: Secondary | ICD-10-CM | POA: Diagnosis not present

## 2013-01-26 LAB — HEMOGLOBIN A1C: Hgb A1c MFr Bld: 5.9 % (ref 4.6–6.5)

## 2013-03-14 ENCOUNTER — Other Ambulatory Visit: Payer: Self-pay | Admitting: Internal Medicine

## 2013-04-07 ENCOUNTER — Other Ambulatory Visit: Payer: Self-pay | Admitting: Internal Medicine

## 2013-05-09 ENCOUNTER — Other Ambulatory Visit: Payer: Self-pay | Admitting: Internal Medicine

## 2013-06-03 ENCOUNTER — Other Ambulatory Visit: Payer: Self-pay | Admitting: Internal Medicine

## 2013-07-10 ENCOUNTER — Other Ambulatory Visit: Payer: Self-pay | Admitting: Dermatology

## 2013-07-10 DIAGNOSIS — D485 Neoplasm of uncertain behavior of skin: Secondary | ICD-10-CM | POA: Diagnosis not present

## 2013-08-09 ENCOUNTER — Other Ambulatory Visit: Payer: Self-pay | Admitting: Dermatology

## 2013-08-09 DIAGNOSIS — D485 Neoplasm of uncertain behavior of skin: Secondary | ICD-10-CM | POA: Diagnosis not present

## 2013-08-10 DIAGNOSIS — D239 Other benign neoplasm of skin, unspecified: Secondary | ICD-10-CM | POA: Diagnosis not present

## 2013-08-10 DIAGNOSIS — Z85828 Personal history of other malignant neoplasm of skin: Secondary | ICD-10-CM | POA: Diagnosis not present

## 2013-08-10 DIAGNOSIS — L570 Actinic keratosis: Secondary | ICD-10-CM | POA: Diagnosis not present

## 2013-08-10 DIAGNOSIS — D2370 Other benign neoplasm of skin of unspecified lower limb, including hip: Secondary | ICD-10-CM | POA: Diagnosis not present

## 2013-08-16 DIAGNOSIS — Z23 Encounter for immunization: Secondary | ICD-10-CM | POA: Diagnosis not present

## 2013-10-07 ENCOUNTER — Other Ambulatory Visit: Payer: Self-pay | Admitting: Internal Medicine

## 2013-10-08 NOTE — Telephone Encounter (Signed)
Ramipril refilled per protocol 

## 2013-10-10 ENCOUNTER — Other Ambulatory Visit: Payer: Self-pay | Admitting: Internal Medicine

## 2013-10-10 NOTE — Telephone Encounter (Signed)
Requesting refill on Indocin 50mg  tid-prn #30 Last refill:07-13-12 Last OV:01-25-13-CPE Please advise.//AB/CMA

## 2013-10-11 NOTE — Telephone Encounter (Signed)
OK 

## 2013-11-29 DIAGNOSIS — I10 Essential (primary) hypertension: Secondary | ICD-10-CM | POA: Diagnosis not present

## 2013-11-29 DIAGNOSIS — Z1212 Encounter for screening for malignant neoplasm of rectum: Secondary | ICD-10-CM | POA: Diagnosis not present

## 2013-11-29 DIAGNOSIS — Z1331 Encounter for screening for depression: Secondary | ICD-10-CM | POA: Diagnosis not present

## 2013-11-29 DIAGNOSIS — E7800 Pure hypercholesterolemia, unspecified: Secondary | ICD-10-CM | POA: Diagnosis not present

## 2013-11-29 DIAGNOSIS — Z125 Encounter for screening for malignant neoplasm of prostate: Secondary | ICD-10-CM | POA: Diagnosis not present

## 2013-11-29 DIAGNOSIS — M109 Gout, unspecified: Secondary | ICD-10-CM | POA: Diagnosis not present

## 2014-02-07 ENCOUNTER — Other Ambulatory Visit: Payer: Self-pay | Admitting: *Deleted

## 2014-02-07 MED ORDER — RAMIPRIL 10 MG PO CAPS: ORAL_CAPSULE | ORAL | Status: DC

## 2014-02-07 NOTE — Telephone Encounter (Signed)
Rx sent to the pharmacy by e-script.//AB/CMA 

## 2014-02-21 DIAGNOSIS — Z85828 Personal history of other malignant neoplasm of skin: Secondary | ICD-10-CM | POA: Diagnosis not present

## 2014-02-21 DIAGNOSIS — L821 Other seborrheic keratosis: Secondary | ICD-10-CM | POA: Diagnosis not present

## 2014-02-21 DIAGNOSIS — D239 Other benign neoplasm of skin, unspecified: Secondary | ICD-10-CM | POA: Diagnosis not present

## 2014-03-04 ENCOUNTER — Other Ambulatory Visit: Payer: Self-pay

## 2014-03-04 MED ORDER — FENOFIBRATE 160 MG PO TABS: ORAL_TABLET | ORAL | Status: DC

## 2014-05-30 ENCOUNTER — Other Ambulatory Visit: Payer: Self-pay

## 2014-05-30 MED ORDER — ALLOPURINOL 300 MG PO TABS: ORAL_TABLET | ORAL | Status: DC

## 2014-06-02 ENCOUNTER — Other Ambulatory Visit: Payer: Self-pay | Admitting: Internal Medicine

## 2014-06-12 DIAGNOSIS — I10 Essential (primary) hypertension: Secondary | ICD-10-CM | POA: Diagnosis not present

## 2014-06-12 DIAGNOSIS — M109 Gout, unspecified: Secondary | ICD-10-CM | POA: Diagnosis not present

## 2014-06-13 ENCOUNTER — Other Ambulatory Visit: Payer: Self-pay

## 2014-06-13 MED ORDER — DILTIAZEM HCL 60 MG PO TABS: ORAL_TABLET | ORAL | Status: DC

## 2014-10-01 DIAGNOSIS — Z23 Encounter for immunization: Secondary | ICD-10-CM | POA: Diagnosis not present

## 2014-10-21 ENCOUNTER — Other Ambulatory Visit: Payer: Self-pay

## 2014-10-21 MED ORDER — RAMIPRIL 10 MG PO CAPS: ORAL_CAPSULE | ORAL | Status: AC

## 2014-12-27 ENCOUNTER — Encounter: Payer: Self-pay | Admitting: Gastroenterology

## 2014-12-27 DIAGNOSIS — Z0000 Encounter for general adult medical examination without abnormal findings: Secondary | ICD-10-CM | POA: Diagnosis not present

## 2014-12-27 DIAGNOSIS — I10 Essential (primary) hypertension: Secondary | ICD-10-CM | POA: Diagnosis not present

## 2014-12-27 DIAGNOSIS — Z125 Encounter for screening for malignant neoplasm of prostate: Secondary | ICD-10-CM | POA: Diagnosis not present

## 2014-12-27 DIAGNOSIS — E780 Pure hypercholesterolemia: Secondary | ICD-10-CM | POA: Diagnosis not present

## 2014-12-27 DIAGNOSIS — Z23 Encounter for immunization: Secondary | ICD-10-CM | POA: Diagnosis not present

## 2015-01-02 DIAGNOSIS — Z1212 Encounter for screening for malignant neoplasm of rectum: Secondary | ICD-10-CM | POA: Diagnosis not present

## 2015-01-02 DIAGNOSIS — Z23 Encounter for immunization: Secondary | ICD-10-CM | POA: Diagnosis not present

## 2015-01-02 DIAGNOSIS — I10 Essential (primary) hypertension: Secondary | ICD-10-CM | POA: Diagnosis not present

## 2015-01-02 DIAGNOSIS — Z7982 Long term (current) use of aspirin: Secondary | ICD-10-CM | POA: Diagnosis not present

## 2015-01-02 DIAGNOSIS — D649 Anemia, unspecified: Secondary | ICD-10-CM | POA: Diagnosis not present

## 2015-01-02 DIAGNOSIS — E780 Pure hypercholesterolemia: Secondary | ICD-10-CM | POA: Diagnosis not present

## 2015-01-23 ENCOUNTER — Other Ambulatory Visit: Payer: Self-pay | Admitting: Internal Medicine

## 2015-02-03 DIAGNOSIS — I10 Essential (primary) hypertension: Secondary | ICD-10-CM | POA: Diagnosis not present

## 2015-02-22 ENCOUNTER — Other Ambulatory Visit: Payer: Self-pay | Admitting: Internal Medicine

## 2015-02-25 ENCOUNTER — Ambulatory Visit (AMBULATORY_SURGERY_CENTER): Payer: Self-pay

## 2015-02-25 VITALS — Ht 70.0 in | Wt 199.4 lb

## 2015-02-25 DIAGNOSIS — Z86010 Personal history of colonic polyps: Secondary | ICD-10-CM

## 2015-02-25 MED ORDER — NA SULFATE-K SULFATE-MG SULF 17.5-3.13-1.6 GM/177ML PO SOLN: ORAL | Status: DC

## 2015-02-25 NOTE — Progress Notes (Signed)
Per pt, no allergies to soy or egg products.Pt not taking any weight loss meds or using  O2 at home. 

## 2015-03-11 ENCOUNTER — Encounter: Payer: Self-pay | Admitting: Gastroenterology

## 2015-03-11 ENCOUNTER — Ambulatory Visit (AMBULATORY_SURGERY_CENTER): Payer: Medicare Other | Admitting: Gastroenterology

## 2015-03-11 VITALS — BP 121/70 | HR 47 | Temp 96.7°F | Resp 19 | Ht 70.0 in | Wt 199.0 lb

## 2015-03-11 DIAGNOSIS — D649 Anemia, unspecified: Secondary | ICD-10-CM | POA: Diagnosis not present

## 2015-03-11 DIAGNOSIS — D120 Benign neoplasm of cecum: Secondary | ICD-10-CM

## 2015-03-11 DIAGNOSIS — K648 Other hemorrhoids: Secondary | ICD-10-CM

## 2015-03-11 DIAGNOSIS — K635 Polyp of colon: Secondary | ICD-10-CM | POA: Diagnosis not present

## 2015-03-11 DIAGNOSIS — Z86010 Personal history of colonic polyps: Secondary | ICD-10-CM

## 2015-03-11 DIAGNOSIS — I10 Essential (primary) hypertension: Secondary | ICD-10-CM | POA: Diagnosis not present

## 2015-03-11 HISTORY — PX: COLONOSCOPY: SHX174

## 2015-03-11 MED ORDER — SODIUM CHLORIDE 0.9 % IV SOLN
500.0000 mL | INTRAVENOUS | Status: DC
Start: 2015-03-11 — End: 2015-03-12

## 2015-03-11 NOTE — Op Note (Signed)
[place]  [address] [place], [address]   COLONOSCOPY PROCEDURE REPORT  PATIENT: Troy House, Troy House  MR#: [id number] BIRTHDATE: 1946/03/19 , 58  yrs. old GENDER: male ENDOSCOPIST: Inda Castle, MD REFERRED BY: PROCEDURE DATE:  03/11/2015 PROCEDURE:   Colonoscopy, screening First Screening Colonoscopy - Avg.  risk and is 50 yrs.  old or older - No.  Prior Negative Screening - Now for repeat screening. N/A  History of Adenoma - Now for follow-up colonoscopy & has been > or = to 3 yrs.  Yes hx of adenoma.  Has been 3 or more years since last colonoscopy. ASA CLASS:   Class II INDICATIONS:PH Colon Adenoma. 2010 MEDICATIONS: Monitored anesthesia care, Propofol 250 mg IV, and Lidocaine 40 mg IV  DESCRIPTION OF PROCEDURE:   After the risks benefits and alternatives of the procedure were thoroughly explained, informed consent was obtained.  The digital rectal exam revealed no abnormalities of the rectum.   The LB [id number]  endoscope was introduced through the anus and advanced to the cecum, which was identified by both the appendix and ileocecal valve. No adverse events experienced.   The quality of the prep was (Suprep was used) good.  The instrument was then slowly withdrawn as the colon was fully examined.      COLON FINDINGS: A flat polyp measuring 2 mm in size was found at the cecum.  A polypectomy was performed with cold forceps.   Internal hemorrhoids were found.  Retroflexed views revealed no abnormalities. The time to cecum = 4.6 Withdrawal time = 6.9   The scope was withdrawn and the procedure completed. COMPLICATIONS: There were no immediate complications.  ENDOSCOPIC IMPRESSION: 1.   Flat polyp was found at the cecum; polypectomy was performed with cold forceps 2.   Internal hemorrhoids  RECOMMENDATIONS: If the polyp(s) removed today are proven to be adenomatous (pre-cancerous) polyps, you will need a repeat colonoscopy in 5 years.   Otherwise you should continue to follow colorectal cancer screening guidelines for "routine risk" patients with colonoscopy in 10 years.  You will receive a letter within 1-2 weeks with the results of your biopsy as well as final recommendations.  Please call my office if you have not received a letter after 3 weeks.  eSigned:  Inda Castle, MD 03/11/2015 9:55 AM   cc:   PATIENT NAME:  [name], [name] MR#: [id number]

## 2015-03-11 NOTE — Patient Instructions (Signed)
Discharge instructions given. Handouts on polyps and hemorrhoids. Resume previous medications. YOU HAD AN ENDOSCOPIC PROCEDURE TODAY AT THE [facility]:   Refer to the procedure report that was given to you for any specific questions about what was found during the examination.  If the procedure report does not answer your questions, please call your gastroenterologist to clarify.  If you requested that your care partner not be given the details of your procedure findings, then the procedure report has been included in a sealed envelope for you to review at your convenience later.  YOU SHOULD EXPECT: Some feelings of bloating in the abdomen. Passage of more gas than usual.  Walking can help get rid of the air that was put into your GI tract during the procedure and reduce the bloating. If you had a lower endoscopy (such as a colonoscopy or flexible sigmoidoscopy) you may notice spotting of blood in your stool or on the toilet paper. If you underwent a bowel prep for your procedure, you may not have a normal bowel movement for a few days.  Please Note:  You might notice some irritation and congestion in your nose or some drainage.  This is from the oxygen used during your procedure.  There is no need for concern and it should clear up in a day or so.  SYMPTOMS TO REPORT IMMEDIATELY:   Following lower endoscopy (colonoscopy or flexible sigmoidoscopy):  Excessive amounts of blood in the stool  Significant tenderness or worsening of abdominal pains  Swelling of the abdomen that is new, acute  Fever of 100F or higher   For urgent or emergent issues, a gastroenterologist can be reached at any hour by calling [phone].   DIET: Your first meal following the procedure should be a small meal and then it is ok to progress to your normal diet. Heavy or fried foods are harder to digest and may make you feel nauseous or bloated.  Likewise, meals heavy in dairy and vegetables can increase  bloating.  Drink plenty of fluids but you should avoid alcoholic beverages for 24 hours.  ACTIVITY:  You should plan to take it easy for the rest of today and you should NOT DRIVE or use heavy machinery until tomorrow (because of the sedation medicines used during the test).    FOLLOW UP: Our staff will call the number listed on your records the next business day following your procedure to check on you and address any questions or concerns that you may have regarding the information given to you following your procedure. If we do not reach you, we will leave a message.  However, if you are feeling well and you are not experiencing any problems, there is no need to return our call.  We will assume that you have returned to your regular daily activities without incident.  If any biopsies were taken you will be contacted by phone or by letter within the next 1-3 weeks.  Please call us at [phone] if you have not heard about the biopsies in 3 weeks.    SIGNATURES/CONFIDENTIALITY: You and/or your care partner have signed paperwork which will be entered into your electronic medical record.  These signatures attest to the fact that that the information above on your After Visit Summary has been reviewed and is understood.  Full responsibility of the confidentiality of this discharge information lies with you and/or your care-partner. 

## 2015-03-11 NOTE — Progress Notes (Signed)
Stable to RR 

## 2015-03-11 NOTE — Progress Notes (Signed)
Called to room to assist during endoscopic procedure.  Patient ID and intended procedure confirmed with present staff. Received instructions for my participation in the procedure from the performing physician.  

## 2015-03-12 ENCOUNTER — Telehealth: Payer: Self-pay

## 2015-03-12 NOTE — Telephone Encounter (Signed)
  Follow up Call-  Call back number 03/11/2015  Post procedure Call Back phone  # [phone]. [phone]  Permission to leave phone message Yes     Patient questions:  Do you have a fever, pain , or abdominal swelling? No. Pain Score  0 *  Have you tolerated food without any problems? Yes.    Have you been able to return to your normal activities? Yes.    Do you have any questions about your discharge instructions: Diet   No. Medications  No. Follow up visit  No.  Do you have questions or concerns about your Care? No.  Actions: * If pain score is 4 or above: No action needed, pain <4.

## 2015-03-18 ENCOUNTER — Encounter: Payer: Self-pay | Admitting: Gastroenterology

## 2015-03-18 DIAGNOSIS — L821 Other seborrheic keratosis: Secondary | ICD-10-CM | POA: Diagnosis not present

## 2015-03-18 DIAGNOSIS — D225 Melanocytic nevi of trunk: Secondary | ICD-10-CM | POA: Diagnosis not present

## 2015-03-18 DIAGNOSIS — D2271 Melanocytic nevi of right lower limb, including hip: Secondary | ICD-10-CM | POA: Diagnosis not present

## 2015-03-18 DIAGNOSIS — Z85828 Personal history of other malignant neoplasm of skin: Secondary | ICD-10-CM | POA: Diagnosis not present

## 2015-09-24 ENCOUNTER — Ambulatory Visit: Payer: Medicare Other | Admitting: Sports Medicine

## 2015-09-29 DIAGNOSIS — Z23 Encounter for immunization: Secondary | ICD-10-CM | POA: Diagnosis not present

## 2015-10-07 ENCOUNTER — Ambulatory Visit (INDEPENDENT_AMBULATORY_CARE_PROVIDER_SITE_OTHER): Payer: Medicare Other | Admitting: Sports Medicine

## 2015-10-07 ENCOUNTER — Encounter: Payer: Self-pay | Admitting: Sports Medicine

## 2015-10-07 VITALS — BP 160/82 | Ht 70.0 in | Wt 185.0 lb

## 2015-10-07 DIAGNOSIS — M25561 Pain in right knee: Secondary | ICD-10-CM

## 2015-10-07 MED ORDER — NITROGLYCERIN 0.2 MG/HR TD PT24: MEDICATED_PATCH | TRANSDERMAL | Status: DC

## 2015-10-07 NOTE — Assessment & Plan Note (Addendum)
Bone spur visualized on ultrasound. Likely etiology of patient's pain. Discussed eccentric exercise routine for patient to perform, in addition to starting nitro patches. Patient counseled on dangers of using nitro patches in addition to sildenafil and informed to not take medication together. Patient to follow-up in 6 weeks for reevaluation and ultrasound. Red flags discussed.  Patient seen and evaluated with the resident. I agree with the plan of care. Ultrasound findings show calcification in the distal patellar tendon at the insertion of the tibial tubercle. This is his location of pain. No joint effusion. Remainder of his knee exam is unremarkable. Reassurance is given. Patient will start the nitroglycerin protocol using a quarter patch daily and he is warned about stopping sildenafil when using the patches. He understands. Patient will follow-up with me in 6 weeks for reevaluation and repeat ultrasound.

## 2015-10-07 NOTE — Progress Notes (Signed)
   Subjective:    Patient ID: Troy House, male    DOB: 1946/06/05, 69 y.o.   MRN: [id number]  HPI Patient presents with an 8 month history of right knee pain. He describes the pain as sharp pain localized to the tibial tuberosity. Pain intermittent and is worse with pressure and flexion. No swelling. No catching, locking or giving out. Symptoms last a few seconds and subside, however, he has the pain daily. He reports no precipitating injury. No change in activities. He square dances 4 hours weekly, walks intermittently and goes skiing for two weeks per year.   Review of Systems  Musculoskeletal: Negative for joint swelling and gait problem.       Objective:  BP 160/82 mmHg  Ht 5\' 10"  (1.778 m)  Wt 185 lb (83.915 kg)  BMI 26.54 kg/m2   Physical Exam  General: well appearing, no distress Right knee: Appears normal with no swelling. No crepitus. No joint line tenderness. No tenderness over patellar tendon or over tibial tuberosity. Active flexion to 130 degrees with active extension to 0 degrees. Anterior drawer, valgus/varus, McMurray negative. 5/5 flexion and extension strength  Ultrasound: Calcification visualized in long and short axis of distal patellar tendon at insertion of tibial tubercle.      Assessment & Plan:

## 2015-10-07 NOTE — Patient Instructions (Signed)

## 2015-10-10 DIAGNOSIS — H2513 Age-related nuclear cataract, bilateral: Secondary | ICD-10-CM | POA: Diagnosis not present

## 2015-11-18 ENCOUNTER — Encounter: Payer: Self-pay | Admitting: Sports Medicine

## 2015-11-18 ENCOUNTER — Ambulatory Visit (INDEPENDENT_AMBULATORY_CARE_PROVIDER_SITE_OTHER): Payer: Medicare Other | Admitting: Sports Medicine

## 2015-11-18 VITALS — BP 120/60 | Ht 70.0 in | Wt 185.0 lb

## 2015-11-18 DIAGNOSIS — M25561 Pain in right knee: Secondary | ICD-10-CM

## 2015-11-18 NOTE — Progress Notes (Signed)
   Subjective:    Patient ID: Troy House, male    DOB: Mar 05, 1946, 70 y.o.   MRN: [id number]  HPI   Patient comes in today for follow-up on right knee pain. Pain has resolved. He noticed an almost immediate relief of his pain after using nitroglycerin patches. He has been using a quarter patch for the past 6 weeks. Also doing his decline squats. He is very pleased with his progress to date.    Review of Systems     Objective:   Physical Exam Well-developed, well-nourished. No acute distress  Right knee: Full range of motion. No effusion. No tenderness to palpation along the patellar tendon nor over the tibial tuberosity. Knee is grossly stable to ligamentous exam. Neurovascularly intact distally.  MSK ultrasound of the right patellar tendon was repeated today. Once again seen is a small area of calcification within the mid substance of the distal patella at the insertion onto the tibial tubercle. I do not see any hypoechoic changes to suggest tearing however.       Assessment & Plan:  Improving right knee pain secondary to distal patellar tendinopathy  Patient has made excellent progress to date. He will continue with his nitroglycerin patches for 2 more weeks which will give him a total of 8 weeks of treatment. He will continue with his home exercise program but may decrease this to 3 times a week. If he begins to experience returning pain after stopping the nitroglycerin, he is instructed to resume the patches for 4 additional weeks. He may continue with activity as tolerated but I did caution him about kneeling directly on his right knee. Follow-up with me as needed.

## 2016-01-02 ENCOUNTER — Encounter: Payer: Self-pay | Admitting: Gastroenterology

## 2016-01-20 DIAGNOSIS — D485 Neoplasm of uncertain behavior of skin: Secondary | ICD-10-CM | POA: Diagnosis not present

## 2016-01-20 DIAGNOSIS — B079 Viral wart, unspecified: Secondary | ICD-10-CM | POA: Diagnosis not present

## 2016-01-20 DIAGNOSIS — Z23 Encounter for immunization: Secondary | ICD-10-CM | POA: Diagnosis not present

## 2016-01-21 DIAGNOSIS — E7800 Pure hypercholesterolemia, unspecified: Secondary | ICD-10-CM | POA: Diagnosis not present

## 2016-01-21 DIAGNOSIS — Z125 Encounter for screening for malignant neoplasm of prostate: Secondary | ICD-10-CM | POA: Diagnosis not present

## 2016-01-21 DIAGNOSIS — D649 Anemia, unspecified: Secondary | ICD-10-CM | POA: Diagnosis not present

## 2016-01-21 DIAGNOSIS — Z7982 Long term (current) use of aspirin: Secondary | ICD-10-CM | POA: Diagnosis not present

## 2016-01-26 DIAGNOSIS — Z23 Encounter for immunization: Secondary | ICD-10-CM | POA: Diagnosis not present

## 2016-01-26 DIAGNOSIS — Z87891 Personal history of nicotine dependence: Secondary | ICD-10-CM | POA: Diagnosis not present

## 2016-01-26 DIAGNOSIS — I10 Essential (primary) hypertension: Secondary | ICD-10-CM | POA: Diagnosis not present

## 2016-01-26 DIAGNOSIS — D649 Anemia, unspecified: Secondary | ICD-10-CM | POA: Diagnosis not present

## 2016-01-26 DIAGNOSIS — Z86010 Personal history of colonic polyps: Secondary | ICD-10-CM | POA: Diagnosis not present

## 2016-01-26 DIAGNOSIS — Z91030 Bee allergy status: Secondary | ICD-10-CM | POA: Diagnosis not present

## 2016-01-26 DIAGNOSIS — Z1212 Encounter for screening for malignant neoplasm of rectum: Secondary | ICD-10-CM | POA: Diagnosis not present

## 2016-01-27 ENCOUNTER — Other Ambulatory Visit: Payer: Self-pay | Admitting: Internal Medicine

## 2016-01-27 DIAGNOSIS — I10 Essential (primary) hypertension: Secondary | ICD-10-CM

## 2016-01-28 ENCOUNTER — Ambulatory Visit
Admission: RE | Admit: 2016-01-28 | Discharge: 2016-01-28 | Disposition: A | Discharge status: Home / Self Care | Payer: Medicare Other | Source: Ambulatory Visit | Attending: Internal Medicine | Admitting: Internal Medicine

## 2016-01-28 DIAGNOSIS — I10 Essential (primary) hypertension: Secondary | ICD-10-CM

## 2016-01-28 DIAGNOSIS — E042 Nontoxic multinodular goiter: Secondary | ICD-10-CM | POA: Diagnosis not present

## 2016-02-02 DIAGNOSIS — Z0000 Encounter for general adult medical examination without abnormal findings: Secondary | ICD-10-CM | POA: Diagnosis not present

## 2016-02-09 DIAGNOSIS — M9903 Segmental and somatic dysfunction of lumbar region: Secondary | ICD-10-CM | POA: Diagnosis not present

## 2016-02-09 DIAGNOSIS — M9902 Segmental and somatic dysfunction of thoracic region: Secondary | ICD-10-CM | POA: Diagnosis not present

## 2016-02-09 DIAGNOSIS — S29012A Strain of muscle and tendon of back wall of thorax, initial encounter: Secondary | ICD-10-CM | POA: Diagnosis not present

## 2016-02-09 DIAGNOSIS — M5137 Other intervertebral disc degeneration, lumbosacral region: Secondary | ICD-10-CM | POA: Diagnosis not present

## 2016-02-09 DIAGNOSIS — M5417 Radiculopathy, lumbosacral region: Secondary | ICD-10-CM | POA: Diagnosis not present

## 2016-02-09 DIAGNOSIS — M9905 Segmental and somatic dysfunction of pelvic region: Secondary | ICD-10-CM | POA: Diagnosis not present

## 2016-02-11 DIAGNOSIS — M9902 Segmental and somatic dysfunction of thoracic region: Secondary | ICD-10-CM | POA: Diagnosis not present

## 2016-02-11 DIAGNOSIS — M9903 Segmental and somatic dysfunction of lumbar region: Secondary | ICD-10-CM | POA: Diagnosis not present

## 2016-02-11 DIAGNOSIS — M5137 Other intervertebral disc degeneration, lumbosacral region: Secondary | ICD-10-CM | POA: Diagnosis not present

## 2016-02-11 DIAGNOSIS — S29012A Strain of muscle and tendon of back wall of thorax, initial encounter: Secondary | ICD-10-CM | POA: Diagnosis not present

## 2016-02-11 DIAGNOSIS — M5417 Radiculopathy, lumbosacral region: Secondary | ICD-10-CM | POA: Diagnosis not present

## 2016-02-11 DIAGNOSIS — M9905 Segmental and somatic dysfunction of pelvic region: Secondary | ICD-10-CM | POA: Diagnosis not present

## 2016-02-12 DIAGNOSIS — M9905 Segmental and somatic dysfunction of pelvic region: Secondary | ICD-10-CM | POA: Diagnosis not present

## 2016-02-12 DIAGNOSIS — M9902 Segmental and somatic dysfunction of thoracic region: Secondary | ICD-10-CM | POA: Diagnosis not present

## 2016-02-12 DIAGNOSIS — M5137 Other intervertebral disc degeneration, lumbosacral region: Secondary | ICD-10-CM | POA: Diagnosis not present

## 2016-02-12 DIAGNOSIS — M5417 Radiculopathy, lumbosacral region: Secondary | ICD-10-CM | POA: Diagnosis not present

## 2016-02-12 DIAGNOSIS — M9903 Segmental and somatic dysfunction of lumbar region: Secondary | ICD-10-CM | POA: Diagnosis not present

## 2016-02-12 DIAGNOSIS — S29012A Strain of muscle and tendon of back wall of thorax, initial encounter: Secondary | ICD-10-CM | POA: Diagnosis not present

## 2016-02-17 DIAGNOSIS — M9905 Segmental and somatic dysfunction of pelvic region: Secondary | ICD-10-CM | POA: Diagnosis not present

## 2016-02-17 DIAGNOSIS — S29012A Strain of muscle and tendon of back wall of thorax, initial encounter: Secondary | ICD-10-CM | POA: Diagnosis not present

## 2016-02-17 DIAGNOSIS — M5137 Other intervertebral disc degeneration, lumbosacral region: Secondary | ICD-10-CM | POA: Diagnosis not present

## 2016-02-17 DIAGNOSIS — M9902 Segmental and somatic dysfunction of thoracic region: Secondary | ICD-10-CM | POA: Diagnosis not present

## 2016-02-17 DIAGNOSIS — M9903 Segmental and somatic dysfunction of lumbar region: Secondary | ICD-10-CM | POA: Diagnosis not present

## 2016-02-17 DIAGNOSIS — M5417 Radiculopathy, lumbosacral region: Secondary | ICD-10-CM | POA: Diagnosis not present

## 2016-02-19 DIAGNOSIS — M5417 Radiculopathy, lumbosacral region: Secondary | ICD-10-CM | POA: Diagnosis not present

## 2016-02-19 DIAGNOSIS — S29012A Strain of muscle and tendon of back wall of thorax, initial encounter: Secondary | ICD-10-CM | POA: Diagnosis not present

## 2016-02-19 DIAGNOSIS — M9902 Segmental and somatic dysfunction of thoracic region: Secondary | ICD-10-CM | POA: Diagnosis not present

## 2016-02-19 DIAGNOSIS — M5137 Other intervertebral disc degeneration, lumbosacral region: Secondary | ICD-10-CM | POA: Diagnosis not present

## 2016-02-19 DIAGNOSIS — M9903 Segmental and somatic dysfunction of lumbar region: Secondary | ICD-10-CM | POA: Diagnosis not present

## 2016-02-19 DIAGNOSIS — M9905 Segmental and somatic dysfunction of pelvic region: Secondary | ICD-10-CM | POA: Diagnosis not present

## 2016-02-24 DIAGNOSIS — M9905 Segmental and somatic dysfunction of pelvic region: Secondary | ICD-10-CM | POA: Diagnosis not present

## 2016-02-24 DIAGNOSIS — S29012A Strain of muscle and tendon of back wall of thorax, initial encounter: Secondary | ICD-10-CM | POA: Diagnosis not present

## 2016-02-24 DIAGNOSIS — M5137 Other intervertebral disc degeneration, lumbosacral region: Secondary | ICD-10-CM | POA: Diagnosis not present

## 2016-02-24 DIAGNOSIS — M9903 Segmental and somatic dysfunction of lumbar region: Secondary | ICD-10-CM | POA: Diagnosis not present

## 2016-02-24 DIAGNOSIS — M9902 Segmental and somatic dysfunction of thoracic region: Secondary | ICD-10-CM | POA: Diagnosis not present

## 2016-02-24 DIAGNOSIS — M5417 Radiculopathy, lumbosacral region: Secondary | ICD-10-CM | POA: Diagnosis not present

## 2016-02-26 DIAGNOSIS — S29012A Strain of muscle and tendon of back wall of thorax, initial encounter: Secondary | ICD-10-CM | POA: Diagnosis not present

## 2016-02-26 DIAGNOSIS — M9902 Segmental and somatic dysfunction of thoracic region: Secondary | ICD-10-CM | POA: Diagnosis not present

## 2016-02-26 DIAGNOSIS — M9905 Segmental and somatic dysfunction of pelvic region: Secondary | ICD-10-CM | POA: Diagnosis not present

## 2016-02-26 DIAGNOSIS — M5137 Other intervertebral disc degeneration, lumbosacral region: Secondary | ICD-10-CM | POA: Diagnosis not present

## 2016-02-26 DIAGNOSIS — M9903 Segmental and somatic dysfunction of lumbar region: Secondary | ICD-10-CM | POA: Diagnosis not present

## 2016-02-26 DIAGNOSIS — M5417 Radiculopathy, lumbosacral region: Secondary | ICD-10-CM | POA: Diagnosis not present

## 2016-03-01 DIAGNOSIS — M5417 Radiculopathy, lumbosacral region: Secondary | ICD-10-CM | POA: Diagnosis not present

## 2016-03-01 DIAGNOSIS — M9905 Segmental and somatic dysfunction of pelvic region: Secondary | ICD-10-CM | POA: Diagnosis not present

## 2016-03-01 DIAGNOSIS — M5137 Other intervertebral disc degeneration, lumbosacral region: Secondary | ICD-10-CM | POA: Diagnosis not present

## 2016-03-01 DIAGNOSIS — S29012A Strain of muscle and tendon of back wall of thorax, initial encounter: Secondary | ICD-10-CM | POA: Diagnosis not present

## 2016-03-01 DIAGNOSIS — M9902 Segmental and somatic dysfunction of thoracic region: Secondary | ICD-10-CM | POA: Diagnosis not present

## 2016-03-01 DIAGNOSIS — M9903 Segmental and somatic dysfunction of lumbar region: Secondary | ICD-10-CM | POA: Diagnosis not present

## 2016-03-24 DIAGNOSIS — D225 Melanocytic nevi of trunk: Secondary | ICD-10-CM | POA: Diagnosis not present

## 2016-03-24 DIAGNOSIS — Q825 Congenital non-neoplastic nevus: Secondary | ICD-10-CM | POA: Diagnosis not present

## 2016-03-24 DIAGNOSIS — Z85828 Personal history of other malignant neoplasm of skin: Secondary | ICD-10-CM | POA: Diagnosis not present

## 2016-03-24 DIAGNOSIS — L821 Other seborrheic keratosis: Secondary | ICD-10-CM | POA: Diagnosis not present

## 2016-08-26 DIAGNOSIS — Z23 Encounter for immunization: Secondary | ICD-10-CM | POA: Diagnosis not present

## 2016-10-15 DIAGNOSIS — H5212 Myopia, left eye: Secondary | ICD-10-CM | POA: Diagnosis not present

## 2016-10-15 DIAGNOSIS — H2513 Age-related nuclear cataract, bilateral: Secondary | ICD-10-CM | POA: Diagnosis not present

## 2016-10-15 DIAGNOSIS — H5201 Hypermetropia, right eye: Secondary | ICD-10-CM | POA: Diagnosis not present

## 2016-10-27 DIAGNOSIS — H903 Sensorineural hearing loss, bilateral: Secondary | ICD-10-CM | POA: Diagnosis not present

## 2017-01-31 DIAGNOSIS — Z125 Encounter for screening for malignant neoplasm of prostate: Secondary | ICD-10-CM | POA: Diagnosis not present

## 2017-01-31 DIAGNOSIS — I10 Essential (primary) hypertension: Secondary | ICD-10-CM | POA: Diagnosis not present

## 2017-01-31 DIAGNOSIS — M109 Gout, unspecified: Secondary | ICD-10-CM | POA: Diagnosis not present

## 2017-01-31 DIAGNOSIS — E7800 Pure hypercholesterolemia, unspecified: Secondary | ICD-10-CM | POA: Diagnosis not present

## 2017-01-31 DIAGNOSIS — Z0000 Encounter for general adult medical examination without abnormal findings: Secondary | ICD-10-CM | POA: Diagnosis not present

## 2017-02-01 DIAGNOSIS — N529 Male erectile dysfunction, unspecified: Secondary | ICD-10-CM | POA: Diagnosis not present

## 2017-02-01 DIAGNOSIS — I10 Essential (primary) hypertension: Secondary | ICD-10-CM | POA: Diagnosis not present

## 2017-02-01 DIAGNOSIS — M109 Gout, unspecified: Secondary | ICD-10-CM | POA: Diagnosis not present

## 2017-02-01 DIAGNOSIS — Z1212 Encounter for screening for malignant neoplasm of rectum: Secondary | ICD-10-CM | POA: Diagnosis not present

## 2017-02-01 DIAGNOSIS — H9190 Unspecified hearing loss, unspecified ear: Secondary | ICD-10-CM | POA: Diagnosis not present

## 2017-03-29 DIAGNOSIS — L821 Other seborrheic keratosis: Secondary | ICD-10-CM | POA: Diagnosis not present

## 2017-03-29 DIAGNOSIS — Z85828 Personal history of other malignant neoplasm of skin: Secondary | ICD-10-CM | POA: Diagnosis not present

## 2017-03-29 DIAGNOSIS — L219 Seborrheic dermatitis, unspecified: Secondary | ICD-10-CM | POA: Diagnosis not present

## 2017-03-29 DIAGNOSIS — D225 Melanocytic nevi of trunk: Secondary | ICD-10-CM | POA: Diagnosis not present

## 2017-03-29 DIAGNOSIS — D2372 Other benign neoplasm of skin of left lower limb, including hip: Secondary | ICD-10-CM | POA: Diagnosis not present

## 2017-03-29 DIAGNOSIS — Q825 Congenital non-neoplastic nevus: Secondary | ICD-10-CM | POA: Diagnosis not present

## 2017-05-03 DIAGNOSIS — H2513 Age-related nuclear cataract, bilateral: Secondary | ICD-10-CM | POA: Diagnosis not present

## 2017-05-03 DIAGNOSIS — H5212 Myopia, left eye: Secondary | ICD-10-CM | POA: Diagnosis not present

## 2017-05-03 DIAGNOSIS — H5201 Hypermetropia, right eye: Secondary | ICD-10-CM | POA: Diagnosis not present

## 2017-08-25 DIAGNOSIS — Z23 Encounter for immunization: Secondary | ICD-10-CM | POA: Diagnosis not present

## 2017-08-31 DIAGNOSIS — I10 Essential (primary) hypertension: Secondary | ICD-10-CM | POA: Diagnosis not present

## 2017-08-31 DIAGNOSIS — Z23 Encounter for immunization: Secondary | ICD-10-CM | POA: Diagnosis not present

## 2017-08-31 DIAGNOSIS — M109 Gout, unspecified: Secondary | ICD-10-CM | POA: Diagnosis not present

## 2017-08-31 DIAGNOSIS — E7800 Pure hypercholesterolemia, unspecified: Secondary | ICD-10-CM | POA: Diagnosis not present

## 2017-09-14 DIAGNOSIS — S60450A Superficial foreign body of right index finger, initial encounter: Secondary | ICD-10-CM | POA: Diagnosis not present

## 2017-09-16 DIAGNOSIS — S60450D Superficial foreign body of right index finger, subsequent encounter: Secondary | ICD-10-CM | POA: Diagnosis not present

## 2018-02-13 DIAGNOSIS — Z125 Encounter for screening for malignant neoplasm of prostate: Secondary | ICD-10-CM | POA: Diagnosis not present

## 2018-02-13 DIAGNOSIS — E7800 Pure hypercholesterolemia, unspecified: Secondary | ICD-10-CM | POA: Diagnosis not present

## 2018-02-13 DIAGNOSIS — I10 Essential (primary) hypertension: Secondary | ICD-10-CM | POA: Diagnosis not present

## 2018-02-16 DIAGNOSIS — L570 Actinic keratosis: Secondary | ICD-10-CM | POA: Diagnosis not present

## 2018-02-16 DIAGNOSIS — Z1212 Encounter for screening for malignant neoplasm of rectum: Secondary | ICD-10-CM | POA: Diagnosis not present

## 2018-02-16 DIAGNOSIS — E7800 Pure hypercholesterolemia, unspecified: Secondary | ICD-10-CM | POA: Diagnosis not present

## 2018-02-16 DIAGNOSIS — N183 Chronic kidney disease, stage 3 (moderate): Secondary | ICD-10-CM | POA: Diagnosis not present

## 2018-02-16 DIAGNOSIS — I10 Essential (primary) hypertension: Secondary | ICD-10-CM | POA: Diagnosis not present

## 2018-02-16 DIAGNOSIS — M109 Gout, unspecified: Secondary | ICD-10-CM | POA: Diagnosis not present

## 2018-02-16 DIAGNOSIS — K635 Polyp of colon: Secondary | ICD-10-CM | POA: Diagnosis not present

## 2018-02-16 DIAGNOSIS — Z7982 Long term (current) use of aspirin: Secondary | ICD-10-CM | POA: Diagnosis not present

## 2018-02-16 DIAGNOSIS — E079 Disorder of thyroid, unspecified: Secondary | ICD-10-CM | POA: Diagnosis not present

## 2018-02-16 DIAGNOSIS — N401 Enlarged prostate with lower urinary tract symptoms: Secondary | ICD-10-CM | POA: Diagnosis not present

## 2018-02-16 DIAGNOSIS — D649 Anemia, unspecified: Secondary | ICD-10-CM | POA: Diagnosis not present

## 2018-02-16 DIAGNOSIS — Z0001 Encounter for general adult medical examination with abnormal findings: Secondary | ICD-10-CM | POA: Diagnosis not present

## 2018-02-16 DIAGNOSIS — Z6826 Body mass index (BMI) 26.0-26.9, adult: Secondary | ICD-10-CM | POA: Diagnosis not present

## 2018-04-05 DIAGNOSIS — Z85828 Personal history of other malignant neoplasm of skin: Secondary | ICD-10-CM | POA: Diagnosis not present

## 2018-04-05 DIAGNOSIS — Q825 Congenital non-neoplastic nevus: Secondary | ICD-10-CM | POA: Diagnosis not present

## 2018-04-05 DIAGNOSIS — D225 Melanocytic nevi of trunk: Secondary | ICD-10-CM | POA: Diagnosis not present

## 2018-04-05 DIAGNOSIS — D2372 Other benign neoplasm of skin of left lower limb, including hip: Secondary | ICD-10-CM | POA: Diagnosis not present

## 2018-04-05 DIAGNOSIS — L570 Actinic keratosis: Secondary | ICD-10-CM | POA: Diagnosis not present

## 2018-04-05 DIAGNOSIS — D485 Neoplasm of uncertain behavior of skin: Secondary | ICD-10-CM | POA: Diagnosis not present

## 2018-06-06 DIAGNOSIS — I10 Essential (primary) hypertension: Secondary | ICD-10-CM | POA: Diagnosis not present

## 2018-06-06 DIAGNOSIS — M25561 Pain in right knee: Secondary | ICD-10-CM | POA: Diagnosis not present

## 2018-06-06 DIAGNOSIS — M545 Low back pain: Secondary | ICD-10-CM | POA: Diagnosis not present

## 2018-06-06 DIAGNOSIS — Z9181 History of falling: Secondary | ICD-10-CM | POA: Diagnosis not present

## 2018-06-06 DIAGNOSIS — E7800 Pure hypercholesterolemia, unspecified: Secondary | ICD-10-CM | POA: Diagnosis not present

## 2018-06-27 DIAGNOSIS — M5417 Radiculopathy, lumbosacral region: Secondary | ICD-10-CM | POA: Diagnosis not present

## 2018-06-27 DIAGNOSIS — S29012A Strain of muscle and tendon of back wall of thorax, initial encounter: Secondary | ICD-10-CM | POA: Diagnosis not present

## 2018-06-27 DIAGNOSIS — M9903 Segmental and somatic dysfunction of lumbar region: Secondary | ICD-10-CM | POA: Diagnosis not present

## 2018-06-27 DIAGNOSIS — M5137 Other intervertebral disc degeneration, lumbosacral region: Secondary | ICD-10-CM | POA: Diagnosis not present

## 2018-06-27 DIAGNOSIS — M9905 Segmental and somatic dysfunction of pelvic region: Secondary | ICD-10-CM | POA: Diagnosis not present

## 2018-06-27 DIAGNOSIS — M9902 Segmental and somatic dysfunction of thoracic region: Secondary | ICD-10-CM | POA: Diagnosis not present

## 2018-06-29 DIAGNOSIS — M5417 Radiculopathy, lumbosacral region: Secondary | ICD-10-CM | POA: Diagnosis not present

## 2018-06-29 DIAGNOSIS — M9905 Segmental and somatic dysfunction of pelvic region: Secondary | ICD-10-CM | POA: Diagnosis not present

## 2018-06-29 DIAGNOSIS — M9902 Segmental and somatic dysfunction of thoracic region: Secondary | ICD-10-CM | POA: Diagnosis not present

## 2018-06-29 DIAGNOSIS — M9903 Segmental and somatic dysfunction of lumbar region: Secondary | ICD-10-CM | POA: Diagnosis not present

## 2018-06-29 DIAGNOSIS — S29012A Strain of muscle and tendon of back wall of thorax, initial encounter: Secondary | ICD-10-CM | POA: Diagnosis not present

## 2018-06-29 DIAGNOSIS — M5137 Other intervertebral disc degeneration, lumbosacral region: Secondary | ICD-10-CM | POA: Diagnosis not present

## 2018-07-03 DIAGNOSIS — M9902 Segmental and somatic dysfunction of thoracic region: Secondary | ICD-10-CM | POA: Diagnosis not present

## 2018-07-03 DIAGNOSIS — M9903 Segmental and somatic dysfunction of lumbar region: Secondary | ICD-10-CM | POA: Diagnosis not present

## 2018-07-03 DIAGNOSIS — M5137 Other intervertebral disc degeneration, lumbosacral region: Secondary | ICD-10-CM | POA: Diagnosis not present

## 2018-07-03 DIAGNOSIS — M9905 Segmental and somatic dysfunction of pelvic region: Secondary | ICD-10-CM | POA: Diagnosis not present

## 2018-07-03 DIAGNOSIS — M5417 Radiculopathy, lumbosacral region: Secondary | ICD-10-CM | POA: Diagnosis not present

## 2018-07-03 DIAGNOSIS — S29012A Strain of muscle and tendon of back wall of thorax, initial encounter: Secondary | ICD-10-CM | POA: Diagnosis not present

## 2018-07-06 DIAGNOSIS — S29012A Strain of muscle and tendon of back wall of thorax, initial encounter: Secondary | ICD-10-CM | POA: Diagnosis not present

## 2018-07-06 DIAGNOSIS — M9903 Segmental and somatic dysfunction of lumbar region: Secondary | ICD-10-CM | POA: Diagnosis not present

## 2018-07-06 DIAGNOSIS — M5137 Other intervertebral disc degeneration, lumbosacral region: Secondary | ICD-10-CM | POA: Diagnosis not present

## 2018-07-06 DIAGNOSIS — M9905 Segmental and somatic dysfunction of pelvic region: Secondary | ICD-10-CM | POA: Diagnosis not present

## 2018-07-06 DIAGNOSIS — M5417 Radiculopathy, lumbosacral region: Secondary | ICD-10-CM | POA: Diagnosis not present

## 2018-07-06 DIAGNOSIS — M9902 Segmental and somatic dysfunction of thoracic region: Secondary | ICD-10-CM | POA: Diagnosis not present

## 2018-07-11 DIAGNOSIS — M5417 Radiculopathy, lumbosacral region: Secondary | ICD-10-CM | POA: Diagnosis not present

## 2018-07-11 DIAGNOSIS — M5137 Other intervertebral disc degeneration, lumbosacral region: Secondary | ICD-10-CM | POA: Diagnosis not present

## 2018-07-11 DIAGNOSIS — M9905 Segmental and somatic dysfunction of pelvic region: Secondary | ICD-10-CM | POA: Diagnosis not present

## 2018-07-11 DIAGNOSIS — M9903 Segmental and somatic dysfunction of lumbar region: Secondary | ICD-10-CM | POA: Diagnosis not present

## 2018-07-11 DIAGNOSIS — S29012A Strain of muscle and tendon of back wall of thorax, initial encounter: Secondary | ICD-10-CM | POA: Diagnosis not present

## 2018-07-11 DIAGNOSIS — M9902 Segmental and somatic dysfunction of thoracic region: Secondary | ICD-10-CM | POA: Diagnosis not present

## 2018-07-13 DIAGNOSIS — M9905 Segmental and somatic dysfunction of pelvic region: Secondary | ICD-10-CM | POA: Diagnosis not present

## 2018-07-13 DIAGNOSIS — M9903 Segmental and somatic dysfunction of lumbar region: Secondary | ICD-10-CM | POA: Diagnosis not present

## 2018-07-13 DIAGNOSIS — M5137 Other intervertebral disc degeneration, lumbosacral region: Secondary | ICD-10-CM | POA: Diagnosis not present

## 2018-07-13 DIAGNOSIS — S29012A Strain of muscle and tendon of back wall of thorax, initial encounter: Secondary | ICD-10-CM | POA: Diagnosis not present

## 2018-07-13 DIAGNOSIS — M9902 Segmental and somatic dysfunction of thoracic region: Secondary | ICD-10-CM | POA: Diagnosis not present

## 2018-07-13 DIAGNOSIS — M5417 Radiculopathy, lumbosacral region: Secondary | ICD-10-CM | POA: Diagnosis not present

## 2018-08-31 DIAGNOSIS — Z23 Encounter for immunization: Secondary | ICD-10-CM | POA: Diagnosis not present

## 2018-11-09 DIAGNOSIS — M549 Dorsalgia, unspecified: Secondary | ICD-10-CM | POA: Diagnosis not present

## 2018-11-20 DIAGNOSIS — Z91030 Bee allergy status: Secondary | ICD-10-CM | POA: Diagnosis not present

## 2018-11-20 DIAGNOSIS — M109 Gout, unspecified: Secondary | ICD-10-CM | POA: Diagnosis not present

## 2018-11-20 DIAGNOSIS — R351 Nocturia: Secondary | ICD-10-CM | POA: Diagnosis not present

## 2018-11-20 DIAGNOSIS — N401 Enlarged prostate with lower urinary tract symptoms: Secondary | ICD-10-CM | POA: Diagnosis not present

## 2018-12-05 DIAGNOSIS — H2513 Age-related nuclear cataract, bilateral: Secondary | ICD-10-CM | POA: Diagnosis not present

## 2018-12-05 DIAGNOSIS — H04123 Dry eye syndrome of bilateral lacrimal glands: Secondary | ICD-10-CM | POA: Diagnosis not present

## 2018-12-05 DIAGNOSIS — H5213 Myopia, bilateral: Secondary | ICD-10-CM | POA: Diagnosis not present

## 2019-01-01 DIAGNOSIS — H2513 Age-related nuclear cataract, bilateral: Secondary | ICD-10-CM | POA: Diagnosis not present

## 2019-01-03 DIAGNOSIS — I10 Essential (primary) hypertension: Secondary | ICD-10-CM | POA: Diagnosis not present

## 2019-01-17 DIAGNOSIS — I10 Essential (primary) hypertension: Secondary | ICD-10-CM | POA: Diagnosis not present

## 2019-02-22 DIAGNOSIS — I10 Essential (primary) hypertension: Secondary | ICD-10-CM | POA: Diagnosis not present

## 2019-02-22 DIAGNOSIS — Z125 Encounter for screening for malignant neoplasm of prostate: Secondary | ICD-10-CM | POA: Diagnosis not present

## 2019-02-26 DIAGNOSIS — M7061 Trochanteric bursitis, right hip: Secondary | ICD-10-CM | POA: Diagnosis not present

## 2019-02-26 DIAGNOSIS — Z1159 Encounter for screening for other viral diseases: Secondary | ICD-10-CM | POA: Diagnosis not present

## 2019-02-26 DIAGNOSIS — I10 Essential (primary) hypertension: Secondary | ICD-10-CM | POA: Diagnosis not present

## 2019-02-26 DIAGNOSIS — N401 Enlarged prostate with lower urinary tract symptoms: Secondary | ICD-10-CM | POA: Diagnosis not present

## 2019-02-26 DIAGNOSIS — R7303 Prediabetes: Secondary | ICD-10-CM | POA: Diagnosis not present

## 2019-02-26 DIAGNOSIS — Z23 Encounter for immunization: Secondary | ICD-10-CM | POA: Diagnosis not present

## 2019-02-26 DIAGNOSIS — M109 Gout, unspecified: Secondary | ICD-10-CM | POA: Diagnosis not present

## 2019-02-26 DIAGNOSIS — Z0000 Encounter for general adult medical examination without abnormal findings: Secondary | ICD-10-CM | POA: Diagnosis not present

## 2019-02-26 DIAGNOSIS — N529 Male erectile dysfunction, unspecified: Secondary | ICD-10-CM | POA: Diagnosis not present

## 2019-02-26 DIAGNOSIS — Z7289 Other problems related to lifestyle: Secondary | ICD-10-CM | POA: Diagnosis not present

## 2019-02-26 DIAGNOSIS — E7800 Pure hypercholesterolemia, unspecified: Secondary | ICD-10-CM | POA: Diagnosis not present

## 2019-05-09 DIAGNOSIS — D2271 Melanocytic nevi of right lower limb, including hip: Secondary | ICD-10-CM | POA: Diagnosis not present

## 2019-05-09 DIAGNOSIS — Z86018 Personal history of other benign neoplasm: Secondary | ICD-10-CM | POA: Diagnosis not present

## 2019-05-09 DIAGNOSIS — D485 Neoplasm of uncertain behavior of skin: Secondary | ICD-10-CM | POA: Diagnosis not present

## 2019-05-09 DIAGNOSIS — L570 Actinic keratosis: Secondary | ICD-10-CM | POA: Diagnosis not present

## 2019-05-09 DIAGNOSIS — L821 Other seborrheic keratosis: Secondary | ICD-10-CM | POA: Diagnosis not present

## 2019-05-09 DIAGNOSIS — D2372 Other benign neoplasm of skin of left lower limb, including hip: Secondary | ICD-10-CM | POA: Diagnosis not present

## 2019-05-09 DIAGNOSIS — D225 Melanocytic nevi of trunk: Secondary | ICD-10-CM | POA: Diagnosis not present

## 2019-05-09 DIAGNOSIS — D2261 Melanocytic nevi of right upper limb, including shoulder: Secondary | ICD-10-CM | POA: Diagnosis not present

## 2019-05-09 DIAGNOSIS — Z85828 Personal history of other malignant neoplasm of skin: Secondary | ICD-10-CM | POA: Diagnosis not present

## 2019-06-19 DIAGNOSIS — D485 Neoplasm of uncertain behavior of skin: Secondary | ICD-10-CM | POA: Diagnosis not present

## 2019-06-19 DIAGNOSIS — L988 Other specified disorders of the skin and subcutaneous tissue: Secondary | ICD-10-CM | POA: Diagnosis not present

## 2019-07-20 DIAGNOSIS — H2513 Age-related nuclear cataract, bilateral: Secondary | ICD-10-CM | POA: Diagnosis not present

## 2019-08-24 DIAGNOSIS — Z23 Encounter for immunization: Secondary | ICD-10-CM | POA: Diagnosis not present

## 2019-08-29 DIAGNOSIS — H2512 Age-related nuclear cataract, left eye: Secondary | ICD-10-CM | POA: Diagnosis not present

## 2019-08-29 DIAGNOSIS — H25812 Combined forms of age-related cataract, left eye: Secondary | ICD-10-CM | POA: Diagnosis not present

## 2019-08-31 DIAGNOSIS — I10 Essential (primary) hypertension: Secondary | ICD-10-CM | POA: Diagnosis not present

## 2019-09-28 DIAGNOSIS — I10 Essential (primary) hypertension: Secondary | ICD-10-CM | POA: Diagnosis not present

## 2019-10-22 DIAGNOSIS — R899 Unspecified abnormal finding in specimens from other organs, systems and tissues: Secondary | ICD-10-CM | POA: Diagnosis not present

## 2019-10-22 DIAGNOSIS — R7989 Other specified abnormal findings of blood chemistry: Secondary | ICD-10-CM | POA: Diagnosis not present

## 2019-12-04 ENCOUNTER — Ambulatory Visit: Payer: Medicare Other

## 2019-12-13 ENCOUNTER — Ambulatory Visit: Payer: Medicare Other | Attending: Internal Medicine

## 2019-12-14 DIAGNOSIS — R7989 Other specified abnormal findings of blood chemistry: Secondary | ICD-10-CM | POA: Diagnosis not present

## 2019-12-14 DIAGNOSIS — R899 Unspecified abnormal finding in specimens from other organs, systems and tissues: Secondary | ICD-10-CM | POA: Diagnosis not present

## 2019-12-14 DIAGNOSIS — D649 Anemia, unspecified: Secondary | ICD-10-CM | POA: Diagnosis not present

## 2020-02-27 DIAGNOSIS — Z7982 Long term (current) use of aspirin: Secondary | ICD-10-CM | POA: Diagnosis not present

## 2020-02-27 DIAGNOSIS — Z125 Encounter for screening for malignant neoplasm of prostate: Secondary | ICD-10-CM | POA: Diagnosis not present

## 2020-02-27 DIAGNOSIS — D649 Anemia, unspecified: Secondary | ICD-10-CM | POA: Diagnosis not present

## 2020-02-27 DIAGNOSIS — I10 Essential (primary) hypertension: Secondary | ICD-10-CM | POA: Diagnosis not present

## 2020-02-27 DIAGNOSIS — D509 Iron deficiency anemia, unspecified: Secondary | ICD-10-CM | POA: Diagnosis not present

## 2020-02-27 DIAGNOSIS — E7800 Pure hypercholesterolemia, unspecified: Secondary | ICD-10-CM | POA: Diagnosis not present

## 2020-03-04 DIAGNOSIS — Z0000 Encounter for general adult medical examination without abnormal findings: Secondary | ICD-10-CM | POA: Diagnosis not present

## 2020-03-04 DIAGNOSIS — B360 Pityriasis versicolor: Secondary | ICD-10-CM | POA: Diagnosis not present

## 2020-03-04 DIAGNOSIS — D649 Anemia, unspecified: Secondary | ICD-10-CM | POA: Diagnosis not present

## 2020-03-04 DIAGNOSIS — N1831 Chronic kidney disease, stage 3a: Secondary | ICD-10-CM | POA: Diagnosis not present

## 2020-03-04 DIAGNOSIS — R7303 Prediabetes: Secondary | ICD-10-CM | POA: Diagnosis not present

## 2020-03-04 DIAGNOSIS — M109 Gout, unspecified: Secondary | ICD-10-CM | POA: Diagnosis not present

## 2020-03-04 DIAGNOSIS — E7800 Pure hypercholesterolemia, unspecified: Secondary | ICD-10-CM | POA: Diagnosis not present

## 2020-03-04 DIAGNOSIS — N529 Male erectile dysfunction, unspecified: Secondary | ICD-10-CM | POA: Diagnosis not present

## 2020-03-04 DIAGNOSIS — N401 Enlarged prostate with lower urinary tract symptoms: Secondary | ICD-10-CM | POA: Diagnosis not present

## 2020-03-04 DIAGNOSIS — G4762 Sleep related leg cramps: Secondary | ICD-10-CM | POA: Diagnosis not present

## 2020-03-04 DIAGNOSIS — I10 Essential (primary) hypertension: Secondary | ICD-10-CM | POA: Diagnosis not present

## 2020-03-04 DIAGNOSIS — Z1212 Encounter for screening for malignant neoplasm of rectum: Secondary | ICD-10-CM | POA: Diagnosis not present

## 2020-03-07 ENCOUNTER — Encounter: Payer: Self-pay | Admitting: Gastroenterology

## 2020-03-07 DIAGNOSIS — M9902 Segmental and somatic dysfunction of thoracic region: Secondary | ICD-10-CM | POA: Diagnosis not present

## 2020-03-07 DIAGNOSIS — M5415 Radiculopathy, thoracolumbar region: Secondary | ICD-10-CM | POA: Diagnosis not present

## 2020-03-07 DIAGNOSIS — M5137 Other intervertebral disc degeneration, lumbosacral region: Secondary | ICD-10-CM | POA: Diagnosis not present

## 2020-03-07 DIAGNOSIS — M5417 Radiculopathy, lumbosacral region: Secondary | ICD-10-CM | POA: Diagnosis not present

## 2020-03-07 DIAGNOSIS — M9905 Segmental and somatic dysfunction of pelvic region: Secondary | ICD-10-CM | POA: Diagnosis not present

## 2020-03-07 DIAGNOSIS — M9903 Segmental and somatic dysfunction of lumbar region: Secondary | ICD-10-CM | POA: Diagnosis not present

## 2020-03-10 DIAGNOSIS — M5415 Radiculopathy, thoracolumbar region: Secondary | ICD-10-CM | POA: Diagnosis not present

## 2020-03-10 DIAGNOSIS — M9905 Segmental and somatic dysfunction of pelvic region: Secondary | ICD-10-CM | POA: Diagnosis not present

## 2020-03-10 DIAGNOSIS — M5137 Other intervertebral disc degeneration, lumbosacral region: Secondary | ICD-10-CM | POA: Diagnosis not present

## 2020-03-10 DIAGNOSIS — M5417 Radiculopathy, lumbosacral region: Secondary | ICD-10-CM | POA: Diagnosis not present

## 2020-03-10 DIAGNOSIS — M9903 Segmental and somatic dysfunction of lumbar region: Secondary | ICD-10-CM | POA: Diagnosis not present

## 2020-03-10 DIAGNOSIS — M9902 Segmental and somatic dysfunction of thoracic region: Secondary | ICD-10-CM | POA: Diagnosis not present

## 2020-03-11 DIAGNOSIS — M5431 Sciatica, right side: Secondary | ICD-10-CM | POA: Diagnosis not present

## 2020-03-12 DIAGNOSIS — M9903 Segmental and somatic dysfunction of lumbar region: Secondary | ICD-10-CM | POA: Diagnosis not present

## 2020-03-12 DIAGNOSIS — M5137 Other intervertebral disc degeneration, lumbosacral region: Secondary | ICD-10-CM | POA: Diagnosis not present

## 2020-03-12 DIAGNOSIS — M9902 Segmental and somatic dysfunction of thoracic region: Secondary | ICD-10-CM | POA: Diagnosis not present

## 2020-03-12 DIAGNOSIS — M5417 Radiculopathy, lumbosacral region: Secondary | ICD-10-CM | POA: Diagnosis not present

## 2020-03-12 DIAGNOSIS — M5415 Radiculopathy, thoracolumbar region: Secondary | ICD-10-CM | POA: Diagnosis not present

## 2020-03-12 DIAGNOSIS — M9905 Segmental and somatic dysfunction of pelvic region: Secondary | ICD-10-CM | POA: Diagnosis not present

## 2020-03-13 DIAGNOSIS — M545 Low back pain: Secondary | ICD-10-CM | POA: Diagnosis not present

## 2020-03-13 DIAGNOSIS — M5416 Radiculopathy, lumbar region: Secondary | ICD-10-CM | POA: Diagnosis not present

## 2020-03-17 DIAGNOSIS — M545 Low back pain: Secondary | ICD-10-CM | POA: Diagnosis not present

## 2020-03-18 DIAGNOSIS — E871 Hypo-osmolality and hyponatremia: Secondary | ICD-10-CM | POA: Diagnosis not present

## 2020-03-18 DIAGNOSIS — R600 Localized edema: Secondary | ICD-10-CM | POA: Diagnosis not present

## 2020-03-18 DIAGNOSIS — D72829 Elevated white blood cell count, unspecified: Secondary | ICD-10-CM | POA: Diagnosis not present

## 2020-03-18 DIAGNOSIS — M545 Low back pain: Secondary | ICD-10-CM | POA: Diagnosis not present

## 2020-03-19 DIAGNOSIS — M9905 Segmental and somatic dysfunction of pelvic region: Secondary | ICD-10-CM | POA: Diagnosis not present

## 2020-03-19 DIAGNOSIS — M9902 Segmental and somatic dysfunction of thoracic region: Secondary | ICD-10-CM | POA: Diagnosis not present

## 2020-03-19 DIAGNOSIS — M5137 Other intervertebral disc degeneration, lumbosacral region: Secondary | ICD-10-CM | POA: Diagnosis not present

## 2020-03-19 DIAGNOSIS — M5416 Radiculopathy, lumbar region: Secondary | ICD-10-CM | POA: Diagnosis not present

## 2020-03-19 DIAGNOSIS — M5415 Radiculopathy, thoracolumbar region: Secondary | ICD-10-CM | POA: Diagnosis not present

## 2020-03-19 DIAGNOSIS — M9903 Segmental and somatic dysfunction of lumbar region: Secondary | ICD-10-CM | POA: Diagnosis not present

## 2020-03-19 DIAGNOSIS — M5417 Radiculopathy, lumbosacral region: Secondary | ICD-10-CM | POA: Diagnosis not present

## 2020-03-20 DIAGNOSIS — M7989 Other specified soft tissue disorders: Secondary | ICD-10-CM | POA: Diagnosis not present

## 2020-03-20 DIAGNOSIS — E871 Hypo-osmolality and hyponatremia: Secondary | ICD-10-CM | POA: Diagnosis not present

## 2020-03-20 DIAGNOSIS — L309 Dermatitis, unspecified: Secondary | ICD-10-CM | POA: Diagnosis not present

## 2020-03-20 DIAGNOSIS — M5136 Other intervertebral disc degeneration, lumbar region: Secondary | ICD-10-CM | POA: Diagnosis not present

## 2020-03-21 DIAGNOSIS — L309 Dermatitis, unspecified: Secondary | ICD-10-CM | POA: Diagnosis not present

## 2020-03-24 DIAGNOSIS — G4762 Sleep related leg cramps: Secondary | ICD-10-CM | POA: Diagnosis not present

## 2020-03-24 DIAGNOSIS — M5137 Other intervertebral disc degeneration, lumbosacral region: Secondary | ICD-10-CM | POA: Diagnosis not present

## 2020-03-24 DIAGNOSIS — M9905 Segmental and somatic dysfunction of pelvic region: Secondary | ICD-10-CM | POA: Diagnosis not present

## 2020-03-24 DIAGNOSIS — M5415 Radiculopathy, thoracolumbar region: Secondary | ICD-10-CM | POA: Diagnosis not present

## 2020-03-24 DIAGNOSIS — M5417 Radiculopathy, lumbosacral region: Secondary | ICD-10-CM | POA: Diagnosis not present

## 2020-03-24 DIAGNOSIS — M9902 Segmental and somatic dysfunction of thoracic region: Secondary | ICD-10-CM | POA: Diagnosis not present

## 2020-03-24 DIAGNOSIS — M9903 Segmental and somatic dysfunction of lumbar region: Secondary | ICD-10-CM | POA: Diagnosis not present

## 2020-03-26 DIAGNOSIS — I10 Essential (primary) hypertension: Secondary | ICD-10-CM | POA: Diagnosis not present

## 2020-03-26 DIAGNOSIS — R600 Localized edema: Secondary | ICD-10-CM | POA: Diagnosis not present

## 2020-03-26 DIAGNOSIS — E871 Hypo-osmolality and hyponatremia: Secondary | ICD-10-CM | POA: Diagnosis not present

## 2020-03-27 DIAGNOSIS — M5136 Other intervertebral disc degeneration, lumbar region: Secondary | ICD-10-CM | POA: Diagnosis not present

## 2020-04-02 DIAGNOSIS — D649 Anemia, unspecified: Secondary | ICD-10-CM | POA: Diagnosis not present

## 2020-04-02 DIAGNOSIS — N1831 Chronic kidney disease, stage 3a: Secondary | ICD-10-CM | POA: Diagnosis not present

## 2020-04-02 DIAGNOSIS — R600 Localized edema: Secondary | ICD-10-CM | POA: Diagnosis not present

## 2020-04-02 DIAGNOSIS — I10 Essential (primary) hypertension: Secondary | ICD-10-CM | POA: Diagnosis not present

## 2020-04-02 DIAGNOSIS — M5136 Other intervertebral disc degeneration, lumbar region: Secondary | ICD-10-CM | POA: Diagnosis not present

## 2020-04-14 DIAGNOSIS — E7800 Pure hypercholesterolemia, unspecified: Secondary | ICD-10-CM | POA: Diagnosis not present

## 2020-04-14 DIAGNOSIS — I10 Essential (primary) hypertension: Secondary | ICD-10-CM | POA: Diagnosis not present

## 2020-04-15 ENCOUNTER — Other Ambulatory Visit: Payer: Self-pay

## 2020-04-15 ENCOUNTER — Ambulatory Visit (AMBULATORY_SURGERY_CENTER): Payer: Self-pay | Admitting: *Deleted

## 2020-04-15 VITALS — Ht 70.0 in | Wt 182.0 lb

## 2020-04-15 DIAGNOSIS — Z86010 Personal history of colonic polyps: Secondary | ICD-10-CM

## 2020-04-15 DIAGNOSIS — I712 Thoracic aortic aneurysm, without rupture: Secondary | ICD-10-CM | POA: Diagnosis not present

## 2020-04-15 DIAGNOSIS — M5431 Sciatica, right side: Secondary | ICD-10-CM | POA: Diagnosis not present

## 2020-04-15 DIAGNOSIS — I2510 Atherosclerotic heart disease of native coronary artery without angina pectoris: Secondary | ICD-10-CM | POA: Diagnosis not present

## 2020-04-15 NOTE — Progress Notes (Signed)
Patient is here in-person for PV. Patient denies any allergies to eggs or soy. Patient denies any problems with anesthesia/sedation. Patient denies any oxygen use at home. Patient denies taking any diet/weight loss medications or blood thinners. Patient is not being treated for MRSA or C-diff. Patient is aware of our care-partner policy and LOVFI-43 safety protocol. Completed covid vaccines x2 per pt in Feb. 2021.

## 2020-04-17 DIAGNOSIS — M5136 Other intervertebral disc degeneration, lumbar region: Secondary | ICD-10-CM | POA: Diagnosis not present

## 2020-04-21 DIAGNOSIS — L578 Other skin changes due to chronic exposure to nonionizing radiation: Secondary | ICD-10-CM | POA: Diagnosis not present

## 2020-04-21 DIAGNOSIS — Q825 Congenital non-neoplastic nevus: Secondary | ICD-10-CM | POA: Diagnosis not present

## 2020-04-21 DIAGNOSIS — D2372 Other benign neoplasm of skin of left lower limb, including hip: Secondary | ICD-10-CM | POA: Diagnosis not present

## 2020-04-21 DIAGNOSIS — D225 Melanocytic nevi of trunk: Secondary | ICD-10-CM | POA: Diagnosis not present

## 2020-04-21 DIAGNOSIS — Z85828 Personal history of other malignant neoplasm of skin: Secondary | ICD-10-CM | POA: Diagnosis not present

## 2020-04-21 DIAGNOSIS — L570 Actinic keratosis: Secondary | ICD-10-CM | POA: Diagnosis not present

## 2020-04-21 DIAGNOSIS — Z86018 Personal history of other benign neoplasm: Secondary | ICD-10-CM | POA: Diagnosis not present

## 2020-04-21 DIAGNOSIS — L821 Other seborrheic keratosis: Secondary | ICD-10-CM | POA: Diagnosis not present

## 2020-04-28 ENCOUNTER — Encounter: Payer: Self-pay | Admitting: Physical Therapy

## 2020-04-28 ENCOUNTER — Ambulatory Visit: Payer: Medicare Other | Attending: Physical Medicine and Rehabilitation | Admitting: Physical Therapy

## 2020-04-28 ENCOUNTER — Other Ambulatory Visit: Payer: Self-pay

## 2020-04-28 DIAGNOSIS — M5441 Lumbago with sciatica, right side: Secondary | ICD-10-CM | POA: Diagnosis present

## 2020-04-28 NOTE — Patient Instructions (Signed)
Access Code: [email or web address] URL: [email or web address] Date: 04/28/2020 Prepared by: Lum Babe  Exercises Supine Sciatic Nerve Glide - 2 x daily - 7 x weekly - 1 sets - 5 reps - 10 hold Supine Sciatic Nerve Mobilization With Leg on Pillow - 2 x daily - 7 x weekly - 1 sets - 5 reps - 10 hold Hooklying Single Knee to Chest Stretch - 2 x daily - 7 x weekly - 1 sets - 10 reps - 5 hold Seated Ankle Dorsiflexion AROM - 2 x daily - 7 x weekly - 1 sets - 10 reps - 3 hold Seated Ankle Alphabet - 2 x daily - 7 x weekly - 3 sets - 4 reps - 1 hold

## 2020-04-28 NOTE — Therapy (Signed)
[place], [place], [address] Phone: [phone]   Fax:  [phone]  Physical Therapy Evaluation  Patient Details  Name: Troy House MRN: [id number] Date of Birth: 09/09/1946 Referring Provider (PT): Nelva Bush   Encounter Date: 04/28/2020   PT End of Session - 04/28/20 1525    Visit Number 1    Date for PT Re-Evaluation 06/28/20    Authorization Type MC    PT Start Time 2952    PT Stop Time 8413    PT Time Calculation (min) 43 min    Activity Tolerance Patient tolerated treatment well    Behavior During Therapy [facility] for tasks assessed/performed           Past Medical History:  Diagnosis Date   Anemia    borderline, PMH of 2000 (B12,folate, iron panel WNL)    B12 deficiency    BPH (benign prostatic hypertrophy)    Gout    PMH of (last flare 2006)   Hx of colonic polyps    PMH of 2011   Hyperglycemia    Hyperlipidemia    Hypertension    Skin cancer    right ankle    Past Surgical History:  Procedure Laterality Date   COLONOSCOPY  03/11/2015   COLONOSCOPY W/ POLYPECTOMY  03/2010   tubular adenoma, Dr Deatra Ina   ELBOW SURGERY     bilaterally for osteophytes   KNEE ARTHROSCOPY     left   lump removed     L breast- benign   POLYPECTOMY     REFRACTIVE SURGERY     OD X 2   SHOULDER SURGERY  2013    Dr Venetia Maxon spurs right   surgery for frozen shoulder  2004   left   VASECTOMY      There were no vitals filed for this visit.    Subjective Assessment - 04/28/20 1451    Subjective Patient reports that he started haivng LBP about 2 months ago, he has right sciatica with some weakness in the right LE, MRI showed DDD and mild stenosis.  He reports that he is very worried about the weakness    Diagnostic tests x-ray and MRI    Patient Stated Goals have less pain, no issues with strength    Currently in Pain? Yes    Pain Score 2     Pain Location Leg    Pain  Orientation Right;Lower    Pain Descriptors / Indicators Burning;Sharp;Shooting;Numbness    Pain Type Acute pain    Pain Radiating Towards sharp pains and cramping sensation inthe right buttock, lateral thigh and in the with lateral shin and foot area.  Numbness in the toes    Pain Onset More than a month ago    Pain Frequency Constant    Aggravating Factors  stairs, pushing door open with foot, swelling pain can be up to 8-9/10    Pain Relieving Factors had epidural injections that helped some especially the right thigh issues, res, finding a comfortable posistion at best a 2/10    Effect of Pain on Daily Activities reports limits walking, limits all ADL's              [facility] PT Assessment - 04/28/20 0001      Assessment   Medical Diagnosis LBP with right sciatica    Referring Provider (PT) Ramos    Onset Date/Surgical Date 03/28/20    Prior Therapy no  Precautions   Precautions None      Balance Screen   Has the patient fallen in the past 6 months No    Has the patient had a decrease in activity level because of a fear of falling?  No    Is the patient reluctant to leave their home because of a fear of falling?  No      Home Environment   Additional Comments does some yardworkhas stairs, gardens      Prior Function   Level of Independence Independent    Vocation Retired    Leisure reports loves fishing, Research officer, political party, square dance once a week      Posture/Postural Control   Posture Comments fewd head, rounded shoulders      ROM / Strength   AROM / PROM / Strength AROM;Strength      AROM   Overall AROM Comments Lumbar ROM decreased 50% for flexion and extension, other motions WFL's, all cause some tightness in the right calf    AROM Assessment Site Ankle    Right/Left Ankle Right    Right Ankle Dorsiflexion -10   10 degrees from neutral     Strength   Overall Strength Comments right hip 4/5, right knee 4-/5,     Strength Assessment Site Ankle    Right/Left Ankle  Right    Right Ankle Dorsiflexion 2/5    Right Ankle Plantar Flexion 3-/5    Right Ankle Inversion 3/5    Right Ankle Eversion 3-/5                      Objective measurements completed on examination: See above findings.                 PT Short Term Goals - 04/28/20 1529      PT SHORT TERM GOAL #1   Title independent iwth initial HEP    Time 2    Period Weeks    Status New             PT Long Term Goals - 04/28/20 1530      PT LONG TERM GOAL #1   Title decrease overall pain 50%    Time 8    Period Weeks    Status New      PT LONG TERM GOAL #2   Title increase right ankle DF to 5 degrees against gravity    Time 8    Period Weeks    Status New      PT LONG TERM GOAL #3   Title walk without difficulty    Time 8    Period Weeks    Status New      PT LONG TERM GOAL #4   Title increase right knee strength to 4+/5    Time 8    Status New                  Plan - 04/28/20 1526    Clinical Impression Statement Patient repports about 2-3 months ago he started having LBP and right LE pain, he reports significant pain at times, he has had an epidural that helped the right buttocka nd thigh pain, but still with significant right shin/calf pain and weakness, he reports that the MRI was mostly negative with some narrowing. He has significant loss of right ankle strength and motions, very tight with right LE flexibility    Stability/Clinical Decision Making Evolving/Moderate complexity    Rehab Potential Good  PT Frequency 2x / week    PT Duration 8 weeks    PT Treatment/Interventions ADLs/Self Care Home Management;Cryotherapy;Electrical Stimulation;Moist Heat;Traction;Ultrasound;Neuromuscular re-education;Balance training;Therapeutic exercise;Therapeutic activities;Functional mobility training;Stair training;Patient/family education;Manual techniques    PT Next Visit Plan try traction    Consulted and Agree with Plan of Care Patient             Patient will benefit from skilled therapeutic intervention in order to improve the following deficits and impairments:  Abnormal gait, Decreased range of motion, Difficulty walking, Increased fascial restricitons, Increased muscle spasms, Decreased activity tolerance, Pain, Improper body mechanics, Impaired flexibility, Decreased balance, Decreased mobility, Decreased strength  Visit Diagnosis: Acute right-sided low back pain with right-sided sciatica - Plan: PT plan of care cert/re-cert     Problem List Patient Active Problem List   Diagnosis Date Noted   Right knee pain 10/07/2015   COLONIC POLYPS, HX OF 12/09/2010   ANEMIA, PERNICIOUS 06/17/2010   HYPERLIPIDEMIA 12/05/2008   HYPERTENSION 12/05/2008   HYPERPLASIA PROSTATE UNS W/O UR OBST & OTH LUTS 12/05/2008   FASTING HYPERGLYCEMIA 12/05/2008   GOUT 12/04/2007    Sumner Boast., PT 04/28/2020, 3:35 PM  [place], [place], [address] Phone: [phone]   Fax:  [phone]  Name: FRANCES JOYNT MRN: [id number] Date of Birth: 05/23/1946

## 2020-04-29 ENCOUNTER — Ambulatory Visit (AMBULATORY_SURGERY_CENTER): Payer: Medicare Other | Admitting: Gastroenterology

## 2020-04-29 ENCOUNTER — Encounter: Payer: Self-pay | Admitting: Gastroenterology

## 2020-04-29 VITALS — BP 154/49 | HR 47 | Temp 96.4°F | Resp 14 | Ht 70.0 in | Wt 182.0 lb

## 2020-04-29 DIAGNOSIS — D128 Benign neoplasm of rectum: Secondary | ICD-10-CM | POA: Diagnosis not present

## 2020-04-29 DIAGNOSIS — D129 Benign neoplasm of anus and anal canal: Secondary | ICD-10-CM

## 2020-04-29 DIAGNOSIS — D122 Benign neoplasm of ascending colon: Secondary | ICD-10-CM

## 2020-04-29 DIAGNOSIS — D123 Benign neoplasm of transverse colon: Secondary | ICD-10-CM

## 2020-04-29 DIAGNOSIS — Z86010 Personal history of colonic polyps: Secondary | ICD-10-CM | POA: Diagnosis present

## 2020-04-29 MED ORDER — SODIUM CHLORIDE 0.9 % IV SOLN: 500.0000 mL | Freq: Once | INTRAVENOUS | Status: DC

## 2020-04-29 NOTE — Progress Notes (Signed)
Pt Drowsy. VSS. To PACU, report to RN. No anesthetic complications noted.  

## 2020-04-29 NOTE — Progress Notes (Signed)
Called to room to assist during endoscopic procedure.  Patient ID and intended procedure confirmed with present staff. Received instructions for my participation in the procedure from the performing physician.  

## 2020-04-29 NOTE — Progress Notes (Signed)
Pt's states no medical or surgical changes since previsit or office visit. 

## 2020-04-29 NOTE — Op Note (Signed)
[name] Patient Name: Troy House Procedure Date: 04/29/2020 9:12 AM MRN: [id number] Endoscopist: Gerrit Heck , MD Age: 74 Referring MD:  Date of Birth: 08-Oct-1946 Gender: Male Account #: [id number] Procedure:                Colonoscopy Indications:              Surveillance: Personal history of adenomatous                            polyps on last colonoscopy 5 years ago                           Colonoscopy in 03/2015 with single 2 mm TA in the                            cecum. Otherwise, no active GI symptoms. Medicines:                Monitored Anesthesia Care Procedure:                Pre-Anesthesia Assessment:                           - Prior to the procedure, a History and Physical                            was performed, and patient medications and                            allergies were reviewed. The patient's tolerance of                            previous anesthesia was also reviewed. The risks                            and benefits of the procedure and the sedation                            options and risks were discussed with the patient.                            All questions were answered, and informed consent                            was obtained. Prior Anticoagulants: The patient has                            taken no previous anticoagulant or antiplatelet                            agents. ASA Grade Assessment: II - A patient with                            mild systemic disease. After reviewing the risks  and benefits, the patient was deemed in                            satisfactory condition to undergo the procedure.                           After obtaining informed consent, the colonoscope                            was passed under direct vision. Throughout the                            procedure, the patient's blood pressure, pulse, and                            oxygen saturations were monitored  continuously. The                            Colonoscope was introduced through the anus and                            advanced to the the terminal ileum. The colonoscopy                            was performed without difficulty. The patient                            tolerated the procedure well. The quality of the                            bowel preparation was good. The terminal ileum,                            ileocecal valve, appendiceal orifice, and rectum                            were photographed. Scope In: 9:28:11 AM Scope Out: 9:52:56 AM Scope Withdrawal Time: 0 hours 19 minutes 38 seconds  Total Procedure Duration: 0 hours 24 minutes 45 seconds  Findings:                 The perianal and digital rectal examinations were                            normal.                           Two sessile polyps were found in the transverse                            colon and ascending colon. The polyps were 4 to 8                            mm in size. These polyps were removed with a cold  snare. Resection and retrieval were complete.                            Estimated blood loss was minimal.                           Two sessile polyps were found in the rectum. The                            polyps were 3 to 5 mm in size. These polyps were                            removed with a cold snare. Resection and retrieval                            were complete. Estimated blood loss was minimal.                           The exam was otherwise normal throughout the                            remainder of the colon.                           The retroflexed view of the distal rectum and anal                            verge was normal and showed no anal or rectal                            abnormalities.                           The terminal ileum appeared normal. Complications:            No immediate complications. Estimated Blood Loss:     Estimated  blood loss was minimal. Impression:               - Two 4 to 8 mm polyps in the transverse colon and                            in the ascending colon, removed with a cold snare.                            Resected and retrieved.                           - Two 3 to 5 mm polyps in the rectum, removed with                            a cold snare. Resected and retrieved.                           - The distal rectum and anal verge are  normal on                            retroflexion view.                           - The examined portion of the ileum was normal. Recommendation:           - Patient has a contact number available for                            emergencies. The signs and symptoms of potential                            delayed complications were discussed with the                            patient. Return to normal activities tomorrow.                            Written discharge instructions were provided to the                            patient.                           - Resume previous diet.                           - Continue present medications.                           - Await pathology results.                           - Repeat colonoscopy for surveillance based on                            pathology results.                           - Return to GI office PRN. Gerrit Heck, MD 04/29/2020 9:57:31 AM

## 2020-04-29 NOTE — Patient Instructions (Signed)
Impression/Recommendations:  Polyp handout given to patient.  Resume previous diet. Continue present medications. Await pathology results.  Repeat colonoscopy for surveillance.  Date to be determined based on pathology results.  Return to GI office as needed.  YOU HAD AN ENDOSCOPIC PROCEDURE TODAY AT [facility]:   Refer to the procedure report that was given to you for any specific questions about what was found during the examination.  If the procedure report does not answer your questions, please call your gastroenterologist to clarify.  If you requested that your care partner not be given the details of your procedure findings, then the procedure report has been included in a sealed envelope for you to review at your convenience later.  YOU SHOULD EXPECT: Some feelings of bloating in the abdomen. Passage of more gas than usual.  Walking can help get rid of the air that was put into your GI tract during the procedure and reduce the bloating. If you had a lower endoscopy (such as a colonoscopy or flexible sigmoidoscopy) you may notice spotting of blood in your stool or on the toilet paper. If you underwent a bowel prep for your procedure, you may not have a normal bowel movement for a few days.  Please Note:  You might notice some irritation and congestion in your nose or some drainage.  This is from the oxygen used during your procedure.  There is no need for concern and it should clear up in a day or so.  SYMPTOMS TO REPORT IMMEDIATELY:   Following lower endoscopy (colonoscopy or flexible sigmoidoscopy):  Excessive amounts of blood in the stool  Significant tenderness or worsening of abdominal pains  Swelling of the abdomen that is new, acute  Fever of 100F or higher For urgent or emergent issues, a gastroenterologist can be reached at any hour by calling [phone]. Do not use MyChart messaging for urgent concerns.    DIET:  We do recommend a small meal at  first, but then you may proceed to your regular diet.  Drink plenty of fluids but you should avoid alcoholic beverages for 24 hours.  ACTIVITY:  You should plan to take it easy for the rest of today and you should NOT DRIVE or use heavy machinery until tomorrow (because of the sedation medicines used during the test).    FOLLOW UP: Our staff will call the number listed on your records 48-72 hours following your procedure to check on you and address any questions or concerns that you may have regarding the information given to you following your procedure. If we do not reach you, we will leave a message.  We will attempt to reach you two times.  During this call, we will ask if you have developed any symptoms of COVID 19. If you develop any symptoms (ie: fever, flu-like symptoms, shortness of breath, cough etc.) before then, please call [phone].  If you test positive for Covid 19 in the 2 weeks post procedure, please call and report this information to [place].    If any biopsies were taken you will be contacted by phone or by letter within the next 1-3 weeks.  Please call us at [phone] if you have not heard about the biopsies in 3 weeks.    SIGNATURES/CONFIDENTIALITY: You and/or your care partner have signed paperwork which will be entered into your electronic medical record.  These signatures attest to the fact that that the information above on your After Visit Summary has been reviewed  and is understood.  Full responsibility of the confidentiality of this discharge information lies with you and/or your care-partner.

## 2020-05-01 ENCOUNTER — Telehealth: Payer: Self-pay | Admitting: *Deleted

## 2020-05-01 ENCOUNTER — Other Ambulatory Visit: Payer: Self-pay

## 2020-05-01 ENCOUNTER — Ambulatory Visit: Payer: Medicare Other | Admitting: Physical Therapy

## 2020-05-01 ENCOUNTER — Encounter: Payer: Self-pay | Admitting: Physical Therapy

## 2020-05-01 DIAGNOSIS — M5441 Lumbago with sciatica, right side: Secondary | ICD-10-CM | POA: Diagnosis not present

## 2020-05-01 NOTE — Telephone Encounter (Signed)
1. Have you developed a fever since your procedure? no  2.   Have you had an respiratory symptoms (SOB or cough) since your procedure? no  3.   Have you tested positive for COVID 19 since your procedure no  4.   Have you had any family members/close contacts diagnosed with the COVID 19 since your procedure?  no   If yes to any of these questions please route to Joylene John, RN and Erenest Rasher, RN Follow up Call-  Call back number 04/29/2020  Post procedure Call Back phone  # [phone]  Permission to leave phone message Yes  Some recent data might be hidden     Patient questions:  Do you have a fever, pain , or abdominal swelling? No. Pain Score  0 *  Have you tolerated food without any problems? Yes.    Have you been able to return to your normal activities? Yes.    Do you have any questions about your discharge instructions: Diet   No. Medications  No. Follow up visit  No.  Do you have questions or concerns about your Care? No.  Actions: * If pain score is 4 or above: No action needed, pain <4.

## 2020-05-01 NOTE — Therapy (Signed)
[place] [address] [place], [place], [address] Phone: [phone]   Fax:  [phone]  Physical Therapy Treatment  Patient Details  Name: Troy House MRN: [id number] Date of Birth: Jul 03, 1946 Referring Provider (PT): Nelva Bush   Encounter Date: 05/01/2020   PT End of Session - 05/01/20 1415    Visit Number 2    Date for PT Re-Evaluation 06/28/20    Authorization Type MC    PT Start Time 4627    PT Stop Time 1424    PT Time Calculation (min) 42 min    Activity Tolerance Patient tolerated treatment well    Behavior During Therapy [facility] for tasks assessed/performed           Past Medical History:  Diagnosis Date  . Anemia    borderline, PMH of 2000 (B12,folate, iron panel WNL)   . B12 deficiency   . BPH (benign prostatic hypertrophy)   . Gout    PMH of (last flare 2006)  . Hx of colonic polyps    PMH of 2011  . Hyperglycemia   . Hyperlipidemia   . Hypertension   . Skin cancer    right ankle    Past Surgical History:  Procedure Laterality Date  . COLONOSCOPY  03/11/2015  . COLONOSCOPY W/ POLYPECTOMY  03/2010   tubular adenoma, Dr Deatra Ina  . ELBOW SURGERY     bilaterally for osteophytes  . KNEE ARTHROSCOPY     left  . lump removed     L breast- benign  . POLYPECTOMY    . REFRACTIVE SURGERY     OD X 2  . SHOULDER SURGERY  2013    Dr Dean/bone spurs right  . surgery for frozen shoulder  2004   left  . VASECTOMY      There were no vitals filed for this visit.   Subjective Assessment - 05/01/20 1347    Subjective Doing ok, reports some pain and stiffness in the back of his R leg    Currently in Pain? Yes    Pain Score 2     Pain Location Leg    Pain Orientation Right                             [facility] Adult PT Treatment/Exercise - 05/01/20 0001      Exercises   Exercises Lumbar      Lumbar Exercises: Stretches   Gastroc Stretch 3 reps;Left;Right   15 seconds      Lumbar Exercises: Aerobic   Recumbent Bike L1 x 3 min     Nustep L4 x 5 min       Lumbar Exercises: Machines for Strengthening   Cybex Lumbar Extension black band 2x15     Other Lumbar Machine Exercise Rows & lats 25lb 2x10      Lumbar Exercises: Standing   Other Standing Lumbar Exercises Toe raises black bar 2x10 Little ROM RLE      Modalities   Modalities Traction      Traction   Type of Traction Lumbar    Min (lbs) 65    Max (lbs) 75    Hold Time 60    Rest Time 20    Time 12                    PT Short Term Goals - 05/01/20 1420      PT  SHORT TERM GOAL #1   Title independent iwth initial HEP    Status Partially Met             PT Long Term Goals - 04/28/20 1530      PT LONG TERM GOAL #1   Title decrease overall pain 50%    Time 8    Period Weeks    Status New      PT LONG TERM GOAL #2   Title increase right ankle DF to 5 degrees against gravity    Time 8    Period Weeks    Status New      PT LONG TERM GOAL #3   Title walk without difficulty    Time 8    Period Weeks    Status New      PT LONG TERM GOAL #4   Title increase right knee strength to 4+/5    Time 8    Status New                 Plan - 05/01/20 1416    Clinical Impression Statement Pt tolerated an initial progression to TE well evident by no subjective reports of increase pain. Tactile cues needed for postural with seated rows and UE extensions. Coe weakness noted with shoulder extensions. No pain reported but increase frustration with his trouble with toe raises.    Stability/Clinical Decision Making Evolving/Moderate complexity    Rehab Potential Good    PT Frequency 2x / week    PT Treatment/Interventions ADLs/Self Care Home Management;Cryotherapy;Electrical Stimulation;Moist Heat;Traction;Ultrasound;Neuromuscular re-education;Balance training;Therapeutic exercise;Therapeutic activities;Functional mobility training;Stair training;Patient/family education;Manual  techniques    PT Next Visit Plan assess traction, core strenght           Patient will benefit from skilled therapeutic intervention in order to improve the following deficits and impairments:  Abnormal gait, Decreased range of motion, Difficulty walking, Increased fascial restricitons, Increased muscle spasms, Decreased activity tolerance, Pain, Improper body mechanics, Impaired flexibility, Decreased balance, Decreased mobility, Decreased strength  Visit Diagnosis: Acute right-sided low back pain with right-sided sciatica     Problem List Patient Active Problem List   Diagnosis Date Noted  . Right knee pain 10/07/2015  . COLONIC POLYPS, HX OF 12/09/2010  . ANEMIA, PERNICIOUS 06/17/2010  . HYPERLIPIDEMIA 12/05/2008  . HYPERTENSION 12/05/2008  . HYPERPLASIA PROSTATE UNS W/O UR OBST & OTH LUTS 12/05/2008  . FASTING HYPERGLYCEMIA 12/05/2008  . GOUT 12/04/2007    Scot Jun, PTA 05/01/2020, 2:21 PM  [place] [address] [place], [place], [address] Phone: [phone]   Fax:  [phone]  Name: Troy House MRN: [id number] Date of Birth: [birth year]

## 2020-05-01 NOTE — Telephone Encounter (Signed)
First attempt, left VM.  

## 2020-05-06 ENCOUNTER — Ambulatory Visit: Payer: Medicare Other | Admitting: Physical Therapy

## 2020-05-06 ENCOUNTER — Encounter: Payer: Self-pay | Admitting: Physical Therapy

## 2020-05-06 ENCOUNTER — Other Ambulatory Visit: Payer: Self-pay

## 2020-05-06 DIAGNOSIS — M5441 Lumbago with sciatica, right side: Secondary | ICD-10-CM | POA: Diagnosis not present

## 2020-05-06 NOTE — Therapy (Signed)
[place], [place], [address] Phone: [phone]   Fax:  [phone]  Physical Therapy Treatment  Patient Details  Name: Troy House MRN: [id number] Date of Birth: July 14, 1946 Referring Provider (PT): Nelva Bush   Encounter Date: 05/06/2020   PT End of Session - 05/06/20 0840    Visit Number 3    Date for PT Re-Evaluation 06/28/20    Authorization Type MC    PT Start Time 0758    PT Stop Time 0851    PT Time Calculation (min) 53 min    Activity Tolerance Patient tolerated treatment well    Behavior During Therapy [facility] for tasks assessed/performed           Past Medical History:  Diagnosis Date  . Anemia    borderline, PMH of 2000 (B12,folate, iron panel WNL)   . B12 deficiency   . BPH (benign prostatic hypertrophy)   . Gout    PMH of (last flare 2006)  . Hx of colonic polyps    PMH of 2011  . Hyperglycemia   . Hyperlipidemia   . Hypertension   . Skin cancer    right ankle    Past Surgical History:  Procedure Laterality Date  . COLONOSCOPY  03/11/2015  . COLONOSCOPY W/ POLYPECTOMY  03/2010   tubular adenoma, Dr Deatra Ina  . ELBOW SURGERY     bilaterally for osteophytes  . KNEE ARTHROSCOPY     left  . lump removed     L breast- benign  . POLYPECTOMY    . REFRACTIVE SURGERY     OD X 2  . SHOULDER SURGERY  2013    Dr Dean/bone spurs right  . surgery for frozen shoulder  2004   left  . VASECTOMY      There were no vitals filed for this visit.   Subjective Assessment - 05/06/20 0803    Subjective Pt reports pain is getting better. Is very worried about the R foot drop.    Currently in Pain? Yes    Pain Score 3     Pain Location Leg    Pain Orientation Right                             [facility] Adult PT Treatment/Exercise - 05/06/20 0001      Lumbar Exercises: Stretches   Gastroc Stretch Right;Left;1 rep;60 seconds    Gastroc Stretch Limitations soleus  stretch R x30 sec    Other Lumbar Stretch Exercise seated forward flexion x3 fwd/lat 5 sec hold    Other Lumbar Stretch Exercise lumbar extension over exercise ball x5 with 5 sec hold      Lumbar Exercises: Aerobic   Nustep L5 x 7 min      Lumbar Exercises: Machines for Strengthening   Cybex Lumbar Extension black band 2x15     Other Lumbar Machine Exercise Rows & lats 25lb 2x15      Lumbar Exercises: Standing   Other Standing Lumbar Exercises Toe raises black bar 2x10 Little ROM RLE    Other Standing Lumbar Exercises mini squat with exercise ball x10 sec hold 5 reps      Traction   Type of Traction Lumbar    Min (lbs) 65    Max (lbs) 75    Hold Time 60    Rest Time 20    Time 12  PT Short Term Goals - 05/01/20 1420      PT SHORT TERM GOAL #1   Title independent iwth initial HEP    Status Partially Met             PT Long Term Goals - 04/28/20 1530      PT LONG TERM GOAL #1   Title decrease overall pain 50%    Time 8    Period Weeks    Status New      PT LONG TERM GOAL #2   Title increase right ankle DF to 5 degrees against gravity    Time 8    Period Weeks    Status New      PT LONG TERM GOAL #3   Title walk without difficulty    Time 8    Period Weeks    Status New      PT LONG TERM GOAL #4   Title increase right knee strength to 4+/5    Time 8    Status New                 Plan - 05/06/20 9030    Clinical Impression Statement Pt tolerated progression of TE well with no reports of increased LB or calf pain during most of today's tx. Pt did have difficulty with increased pain with hamstring stretch. Pt demonstrates limited ROM in R DF. Pt responded well to traction.    PT Treatment/Interventions ADLs/Self Care Home Management;Cryotherapy;Electrical Stimulation;Moist Heat;Traction;Ultrasound;Neuromuscular re-education;Balance training;Therapeutic exercise;Therapeutic activities;Functional mobility training;Stair  training;Patient/family education;Manual techniques    PT Next Visit Plan assess traction, core strenght    Consulted and Agree with Plan of Care Patient           Patient will benefit from skilled therapeutic intervention in order to improve the following deficits and impairments:  Abnormal gait, Decreased range of motion, Difficulty walking, Increased fascial restricitons, Increased muscle spasms, Decreased activity tolerance, Pain, Improper body mechanics, Impaired flexibility, Decreased balance, Decreased mobility, Decreased strength  Visit Diagnosis: Acute right-sided low back pain with right-sided sciatica     Problem List Patient Active Problem List   Diagnosis Date Noted  . Right knee pain 10/07/2015  . COLONIC POLYPS, HX OF 12/09/2010  . ANEMIA, PERNICIOUS 06/17/2010  . HYPERLIPIDEMIA 12/05/2008  . HYPERTENSION 12/05/2008  . HYPERPLASIA PROSTATE UNS W/O UR OBST & OTH LUTS 12/05/2008  . FASTING HYPERGLYCEMIA 12/05/2008  . GOUT 12/04/2007   Amador Cunas, PT, DPT [place] Marte Celani 05/06/2020, 8:43 AM  [address] [place], [place], [address] Phone: [phone]   Fax:  [phone]  Name: Troy House MRN: [id number] Date of Birth: May 31, 1946

## 2020-05-07 ENCOUNTER — Ambulatory Visit: Payer: Medicare Other | Admitting: Physical Therapy

## 2020-05-07 DIAGNOSIS — M5441 Lumbago with sciatica, right side: Secondary | ICD-10-CM | POA: Diagnosis not present

## 2020-05-07 NOTE — Therapy (Cosign Needed)
[place], [place], [address] Phone: [phone]   Fax:  [phone]  Physical Therapy Treatment  Patient Details  Name: Troy House MRN: [id number] Date of Birth: 12-Jul-1946 Referring Provider (PT): Nelva Bush   Encounter Date: 05/07/2020   PT End of Session - 05/07/20 0834    Visit Number 4    Date for PT Re-Evaluation 06/28/20    Authorization Type MC    PT Start Time 0800    PT Stop Time 0845    PT Time Calculation (min) 45 min    Activity Tolerance Patient tolerated treatment well    Behavior During Therapy [facility] for tasks assessed/performed           Past Medical History:  Diagnosis Date  . Anemia    borderline, PMH of 2000 (B12,folate, iron panel WNL)   . B12 deficiency   . BPH (benign prostatic hypertrophy)   . Gout    PMH of (last flare 2006)  . Hx of colonic polyps    PMH of 2011  . Hyperglycemia   . Hyperlipidemia   . Hypertension   . Skin cancer    right ankle    Past Surgical History:  Procedure Laterality Date  . COLONOSCOPY  03/11/2015  . COLONOSCOPY W/ POLYPECTOMY  03/2010   tubular adenoma, Dr Deatra Ina  . ELBOW SURGERY     bilaterally for osteophytes  . KNEE ARTHROSCOPY     left  . lump removed     L breast- benign  . POLYPECTOMY    . REFRACTIVE SURGERY     OD X 2  . SHOULDER SURGERY  2013    Dr Dean/bone spurs right  . surgery for frozen shoulder  2004   left  . VASECTOMY      There were no vitals filed for this visit.   Subjective Assessment - 05/07/20 0802    Subjective Pt reports pain is getting better, however, he feel sore from yesterday's traction in his R lower back.    Currently in Pain? No/denies                             [facility] Adult PT Treatment/Exercise - 05/07/20 0001      Lumbar Exercises: Stretches   Piriformis Stretch 1 rep;30 seconds;Right;Left    Gastroc Stretch Right;Left;2 reps;30 seconds      Lumbar  Exercises: Aerobic   Nustep L5 x 25mn      Lumbar Exercises: Machines for Strengthening   Cybex Lumbar Extension black band 2x15     Other Lumbar Machine Exercise Rows & lats 25lb 2x15      Lumbar Exercises: Standing   Other Standing Lumbar Exercises Toe raises black bar 2x10 Little ROM RLE    Other Standing Lumbar Exercises step ups 6' 2 x10      Modalities   Modalities Traction      Traction   Type of Traction Lumbar    Min (lbs) 65    Max (lbs) 75    Hold Time 60    Rest Time 20    Time 12                    PT Short Term Goals - 05/01/20 1420      PT SHORT TERM GOAL #1   Title independent iwth initial HEP    Status Partially Met  PT Long Term Goals - 05/07/20 0840      PT LONG TERM GOAL #1   Title decrease overall pain 50%    Status Achieved                 Plan - 05/07/20 0835    Clinical Impression Statement Pt tolerated TE well requiring minimal postural cueing. Pt did not experience any increasing pain with TE or stretches. Pt presents with decreased AROM of R DF, however, is WNL for PROM. Pt responds well to traction.    Rehab Potential Good    PT Frequency 2x / week    PT Duration 8 weeks    PT Treatment/Interventions ADLs/Self Care Home Management;Cryotherapy;Electrical Stimulation;Moist Heat;Traction;Ultrasound;Neuromuscular re-education;Balance training;Therapeutic exercise;Therapeutic activities;Functional mobility training;Stair training;Patient/family education;Manual techniques    PT Next Visit Plan Progress to 8" step ups and more functional activities. Core strength.    Consulted and Agree with Plan of Care Patient           Patient will benefit from skilled therapeutic intervention in order to improve the following deficits and impairments:  Abnormal gait, Decreased range of motion, Difficulty walking, Increased fascial restricitons, Increased muscle spasms, Decreased activity tolerance, Pain, Improper body  mechanics, Impaired flexibility, Decreased balance, Decreased mobility, Decreased strength  Visit Diagnosis: No diagnosis found.     Problem List Patient Active Problem List   Diagnosis Date Noted  . Right knee pain 10/07/2015  . COLONIC POLYPS, HX OF 12/09/2010  . ANEMIA, PERNICIOUS 06/17/2010  . HYPERLIPIDEMIA 12/05/2008  . HYPERTENSION 12/05/2008  . HYPERPLASIA PROSTATE UNS W/O UR OBST & OTH LUTS 12/05/2008  . FASTING HYPERGLYCEMIA 12/05/2008  . GOUT 12/04/2007    York Cerise, SPTA 05/07/2020, 8:43 AM  [address] [place], [place], [address] Phone: [phone]   Fax:  [phone]  Name: KONNAR BEN MRN: [id number] Date of Birth: 1946/08/27

## 2020-05-13 ENCOUNTER — Ambulatory Visit: Payer: Medicare Other | Attending: Physical Medicine and Rehabilitation | Admitting: Physical Therapy

## 2020-05-13 ENCOUNTER — Other Ambulatory Visit: Payer: Self-pay

## 2020-05-13 ENCOUNTER — Encounter: Payer: Self-pay | Admitting: Physical Therapy

## 2020-05-13 DIAGNOSIS — M5441 Lumbago with sciatica, right side: Secondary | ICD-10-CM | POA: Diagnosis not present

## 2020-05-13 NOTE — Therapy (Signed)
[place] [address] [place], [place], [address] Phone: [phone]   Fax:  [phone]  Physical Therapy Treatment  Patient Details  Name: Troy House MRN: [id number] Date of Birth: 1946/08/10 Referring Provider (PT): Nelva Bush   Encounter Date: 05/13/2020   PT End of Session - 05/13/20 0830    Visit Number 5    Date for PT Re-Evaluation 06/28/20    PT Start Time 0800    PT Stop Time 0845    PT Time Calculation (min) 45 min    Activity Tolerance Patient tolerated treatment well    Behavior During Therapy [facility] for tasks assessed/performed           Past Medical History:  Diagnosis Date  . Anemia    borderline, PMH of 2000 (B12,folate, iron panel WNL)   . B12 deficiency   . BPH (benign prostatic hypertrophy)   . Gout    PMH of (last flare 2006)  . Hx of colonic polyps    PMH of 2011  . Hyperglycemia   . Hyperlipidemia   . Hypertension   . Skin cancer    right ankle    Past Surgical History:  Procedure Laterality Date  . COLONOSCOPY  03/11/2015  . COLONOSCOPY W/ POLYPECTOMY  03/2010   tubular adenoma, Dr Deatra Ina  . ELBOW SURGERY     bilaterally for osteophytes  . KNEE ARTHROSCOPY     left  . lump removed     L breast- benign  . POLYPECTOMY    . REFRACTIVE SURGERY     OD X 2  . SHOULDER SURGERY  2013    Dr Dean/bone spurs right  . surgery for frozen shoulder  2004   left  . VASECTOMY      There were no vitals filed for this visit.   Subjective Assessment - 05/13/20 0802    Subjective Doing fine, still have some weakness and foot drop in RLE    Currently in Pain? No/denies                             [facility] Adult PT Treatment/Exercise - 05/13/20 0001      Lumbar Exercises: Aerobic   Nustep L5 x 61mn      Lumbar Exercises: Machines for Strengthening   Other Lumbar Machine Exercise Rows & lats 35lb 2x10      Lumbar Exercises: Standing   Other Standing Lumbar  Exercises Lateral step ups 6in x10 each LE, Toe raises black bar 2x10, Straight arm pull downs 25lb 2x10    Other Standing Lumbar Exercises Overhead Ext yello wball 2x10      Lumbar Exercises: Supine   Bridge Compliant;10 reps;2 seconds    Straight Leg Raise 5 reps;2 seconds      Modalities   Modalities Traction      Traction   Min (lbs) 70    Max (lbs) 80    Hold Time 60    Rest Time 20    Time 12                    PT Short Term Goals - 05/01/20 1420      PT SHORT TERM GOAL #1   Title independent iwth initial HEP    Status Partially Met             PT Long Term Goals - 05/13/20 07510  PT LONG TERM GOAL #1   Title decrease overall pain 50%    Status Achieved      PT LONG TERM GOAL #2   Title increase right ankle DF to 5 degrees against gravity    Status On-going      PT LONG TERM GOAL #3   Title walk without difficulty    Status Partially Met                 Plan - 05/13/20 0831    Clinical Impression Statement Pt continues to report LLE weakness. He did well completing all the interventions. When sitting and asked to extend RLE pt cant get full knee extension and reports a painful muscle tightness sensation in the anterior R shaft. SLE also caused this painful sensation. Pt has little to no muscle activation when asked to perform active R ankle DF.    Stability/Clinical Decision Making Evolving/Moderate complexity    Rehab Potential Good    PT Frequency 2x / week    PT Treatment/Interventions ADLs/Self Care Home Management;Cryotherapy;Electrical Stimulation;Moist Heat;Traction;Ultrasound;Neuromuscular re-education;Balance training;Therapeutic exercise;Therapeutic activities;Functional mobility training;Stair training;Patient/family education;Manual techniques    PT Next Visit Plan Progress to 8" step ups and more functional activities. Core strength.           Patient will benefit from skilled therapeutic intervention in order to improve  the following deficits and impairments:  Abnormal gait, Decreased range of motion, Difficulty walking, Increased fascial restricitons, Increased muscle spasms, Decreased activity tolerance, Pain, Improper body mechanics, Impaired flexibility, Decreased balance, Decreased mobility, Decreased strength  Visit Diagnosis: Acute right-sided low back pain with right-sided sciatica     Problem List Patient Active Problem List   Diagnosis Date Noted  . Right knee pain 10/07/2015  . COLONIC POLYPS, HX OF 12/09/2010  . ANEMIA, PERNICIOUS 06/17/2010  . HYPERLIPIDEMIA 12/05/2008  . HYPERTENSION 12/05/2008  . HYPERPLASIA PROSTATE UNS W/O UR OBST & OTH LUTS 12/05/2008  . FASTING HYPERGLYCEMIA 12/05/2008  . GOUT 12/04/2007    Scot Jun, PTA 05/13/2020, 8:34 AM  [address] [place], [place], [address] Phone: [phone]   Fax:  [phone]  Name: Troy House MRN: [id number] Date of Birth: October 31, 1946

## 2020-05-14 DIAGNOSIS — I10 Essential (primary) hypertension: Secondary | ICD-10-CM | POA: Diagnosis not present

## 2020-05-15 ENCOUNTER — Ambulatory Visit: Payer: Medicare Other | Admitting: Physical Therapy

## 2020-05-15 ENCOUNTER — Other Ambulatory Visit: Payer: Self-pay

## 2020-05-15 ENCOUNTER — Encounter: Payer: Self-pay | Admitting: Physical Therapy

## 2020-05-15 DIAGNOSIS — M5441 Lumbago with sciatica, right side: Secondary | ICD-10-CM | POA: Diagnosis not present

## 2020-05-15 NOTE — Therapy (Signed)
[place], [place], [address] Phone: [phone]   Fax:  [phone]  Physical Therapy Treatment  Patient Details  Name: Troy House MRN: [id number] Date of Birth: 1946-07-29 Referring Provider (PT): Nelva Bush   Encounter Date: 05/15/2020   PT End of Session - 05/15/20 0842    Visit Number 6    Date for PT Re-Evaluation 06/28/20    Authorization Type [facility]    PT Start Time 0758    PT Stop Time 0851    PT Time Calculation (min) 53 min    Activity Tolerance Patient tolerated treatment well    Behavior During Therapy [facility] for tasks assessed/performed           Past Medical History:  Diagnosis Date  . Anemia    borderline, PMH of 2000 (B12,folate, iron panel WNL)   . B12 deficiency   . BPH (benign prostatic hypertrophy)   . Gout    PMH of (last flare 2006)  . Hx of colonic polyps    PMH of 2011  . Hyperglycemia   . Hyperlipidemia   . Hypertension   . Skin cancer    right ankle    Past Surgical History:  Procedure Laterality Date  . COLONOSCOPY  03/11/2015  . COLONOSCOPY W/ POLYPECTOMY  03/2010   tubular adenoma, Dr Deatra Ina  . ELBOW SURGERY     bilaterally for osteophytes  . KNEE ARTHROSCOPY     left  . lump removed     L breast- benign  . POLYPECTOMY    . REFRACTIVE SURGERY     OD X 2  . SHOULDER SURGERY  2013    Dr Dean/bone spurs right  . surgery for frozen shoulder  2004   left  . VASECTOMY      There were no vitals filed for this visit.   Subjective Assessment - 05/15/20 0757    Subjective No pain, doing fine, sees MD tomorrow    Currently in Pain? No/denies              [facility] PT Assessment - 05/15/20 0001      AROM   Right Ankle Dorsiflexion -19   19 degrees from neutral      Strength   Right/Left Ankle Right    Right Ankle Dorsiflexion 2/5    Right Ankle Plantar Flexion 3/5    Right Ankle Inversion 3/5    Right Ankle Eversion 3/5                          [facility] Adult PT Treatment/Exercise - 05/15/20 0001      Lumbar Exercises: Aerobic   Recumbent Bike L1 x 3 min     Nustep L5 x 31mn      Lumbar Exercises: Machines for Strengthening   Cybex Knee Extension 5lb 2x15     Cybex Knee Flexion 25lb 2x10 Limited extension ROM     Leg Press 20lb 2x10     Other Lumbar Machine Exercise Rows & lats 35lb 2x10      Lumbar Exercises: Standing   Shoulder Extension Theraband;15 reps;Both;Strengthening   x2   Theraband Level (Shoulder Extension) Level 3 (Green)      Modalities   Modalities Traction      Traction   Min (lbs) 75    Max (lbs) 58    Hold Time 60    Rest Time 20  Time 12                    PT Short Term Goals - 05/01/20 1420      PT SHORT TERM GOAL #1   Title independent iwth initial HEP    Status Partially Met             PT Long Term Goals - 05/15/20 4497      PT LONG TERM GOAL #2   Title increase right ankle DF to 5 degrees against gravity    Status On-going      PT LONG TERM GOAL #3   Title walk without difficulty    Status Partially Met                 Plan - 05/15/20 0843    Clinical Impression Statement R ankle weakness remains overall, no improvement with R ankle dorsiflexion. Pt is unable to fully extend his RLE in a seated position limiting motion with machine level interventions. Pt reports a burring/ fire sensation in R shin when extending RLE in seated position. Postural cues needed with standing shoulder extensions.    Stability/Clinical Decision Making Evolving/Moderate complexity    Rehab Potential Good    PT Frequency 2x / week    PT Duration 8 weeks    PT Treatment/Interventions ADLs/Self Care Home Management;Cryotherapy;Electrical Stimulation;Moist Heat;Traction;Ultrasound;Neuromuscular re-education;Balance training;Therapeutic exercise;Therapeutic activities;Functional mobility training;Stair training;Patient/family education;Manual techniques     PT Next Visit Plan Progress to 8" step ups and more functional activities. Core strength. Get report from MD.           Patient will benefit from skilled therapeutic intervention in order to improve the following deficits and impairments:  Abnormal gait, Decreased range of motion, Difficulty walking, Increased fascial restricitons, Increased muscle spasms, Decreased activity tolerance, Pain, Improper body mechanics, Impaired flexibility, Decreased balance, Decreased mobility, Decreased strength  Visit Diagnosis: Acute right-sided low back pain with right-sided sciatica     Problem List Patient Active Problem List   Diagnosis Date Noted  . Right knee pain 10/07/2015  . COLONIC POLYPS, HX OF 12/09/2010  . ANEMIA, PERNICIOUS 06/17/2010  . HYPERLIPIDEMIA 12/05/2008  . HYPERTENSION 12/05/2008  . HYPERPLASIA PROSTATE UNS W/O UR OBST & OTH LUTS 12/05/2008  . FASTING HYPERGLYCEMIA 12/05/2008  . GOUT 12/04/2007    Scot Jun, PTA 05/15/2020, 8:45 AM  [address] [place], [place], [address] Phone: [phone]   Fax:  [phone]  Name: Troy House MRN: [id number] Date of Birth: 03-Dec-1945

## 2020-05-16 ENCOUNTER — Encounter: Payer: Self-pay | Admitting: Gastroenterology

## 2020-05-16 ENCOUNTER — Other Ambulatory Visit: Payer: Self-pay

## 2020-05-16 DIAGNOSIS — M5126 Other intervertebral disc displacement, lumbar region: Secondary | ICD-10-CM | POA: Diagnosis not present

## 2020-05-16 DIAGNOSIS — M5137 Other intervertebral disc degeneration, lumbosacral region: Secondary | ICD-10-CM | POA: Diagnosis not present

## 2020-05-16 DIAGNOSIS — M48061 Spinal stenosis, lumbar region without neurogenic claudication: Secondary | ICD-10-CM | POA: Diagnosis not present

## 2020-05-16 DIAGNOSIS — M5127 Other intervertebral disc displacement, lumbosacral region: Secondary | ICD-10-CM | POA: Diagnosis not present

## 2020-05-19 ENCOUNTER — Other Ambulatory Visit: Payer: Self-pay

## 2020-05-19 ENCOUNTER — Institutional Professional Consult (permissible substitution) (INDEPENDENT_AMBULATORY_CARE_PROVIDER_SITE_OTHER): Payer: Medicare Other | Admitting: Cardiothoracic Surgery

## 2020-05-19 ENCOUNTER — Encounter: Payer: Self-pay | Admitting: Cardiothoracic Surgery

## 2020-05-19 DIAGNOSIS — I712 Thoracic aortic aneurysm, without rupture, unspecified: Secondary | ICD-10-CM | POA: Insufficient documentation

## 2020-05-20 ENCOUNTER — Encounter: Payer: Self-pay | Admitting: Physical Therapy

## 2020-05-20 ENCOUNTER — Ambulatory Visit: Payer: Medicare Other | Admitting: Physical Therapy

## 2020-05-20 DIAGNOSIS — M5441 Lumbago with sciatica, right side: Secondary | ICD-10-CM

## 2020-05-20 NOTE — Therapy (Signed)
[place], [place], [address] Phone: [phone]   Fax:  [phone]  Physical Therapy Treatment  Patient Details  Name: Troy House MRN: [id number] Date of Birth: 1946/01/06 Referring Provider (PT): Nelva Bush   Encounter Date: 05/20/2020   PT End of Session - 05/20/20 0835    Visit Number 7    Date for PT Re-Evaluation 06/28/20    PT Start Time 5790    PT Stop Time 3833    PT Time Calculation (min) 49 min           Past Medical History:  Diagnosis Date  . Anemia    borderline, PMH of 2000 (B12,folate, iron panel WNL)   . B12 deficiency   . BPH (benign prostatic hypertrophy)   . Gout    PMH of (last flare 2006)  . Hx of colonic polyps    PMH of 2011  . Hyperglycemia   . Hyperlipidemia   . Hypertension   . Skin cancer    right ankle    Past Surgical History:  Procedure Laterality Date  . COLONOSCOPY  03/11/2015  . COLONOSCOPY W/ POLYPECTOMY  03/2010   tubular adenoma, Dr Deatra Ina  . ELBOW SURGERY     bilaterally for osteophytes  . KNEE ARTHROSCOPY     left  . lump removed     L breast- benign  . POLYPECTOMY    . REFRACTIVE SURGERY     OD X 2  . SHOULDER SURGERY  2013    Dr Dean/bone spurs right  . surgery for frozen shoulder  2004   left  . VASECTOMY      There were no vitals filed for this visit.   Subjective Assessment - 05/20/20 0759    Subjective "No seeing any improvement" No pain now, will have a lightening bold down R shin occasionally    Currently in Pain? No/denies                             [facility] Adult PT Treatment/Exercise - 05/20/20 0001      Lumbar Exercises: Aerobic   Elliptical I5 R3 x2 min     Nustep L5 x 72mn      Lumbar Exercises: Machines for Strengthening   Cybex Lumbar Extension black band 2x15     Other Lumbar Machine Exercise Rows & lats 35lb 2x10      Lumbar Exercises: Standing   Shoulder Extension  Both;Strengthening;20 reps;Power TEngineering geologistLimitations 10    Other Standing Lumbar Exercises Toe raises black bar 2x10 Little ROM RLE    Other Standing Lumbar Exercises Hip Ext x10 then x 10 red band       Modalities   Modalities Traction      Traction   Min (lbs) 75    Max (lbs) 85    Hold Time 60    Rest Time 20    Time 12                    PT Short Term Goals - 05/01/20 1420      PT SHORT TERM GOAL #1   Title independent iwth initial HEP    Status Partially Met             PT Long Term Goals - 05/15/20 0812      PT LONG TERM GOAL #2  Title increase right ankle DF to 5 degrees against gravity    Status On-going      PT LONG TERM GOAL #3   Title walk without difficulty    Status Partially Met                 Plan - 05/20/20 0836    Clinical Impression Statement Pt reports no noticeable improvement overall. Reports that he is in the process of getting a brace for his foot drop. Postural weakness noted today's with shoulder extensions. Very little motion from E ankle with toe raises on black. Expressed to pt that we could place him on a 2 week hold, he has a MD apt this afternoon to see how we should proceed. Some Edema noted in R ankle, pt stated as swelling increases so does his pain   Stability/Clinical Decision Making Evolving/Moderate complexity    Rehab Potential Good    PT Frequency 2x / week    PT Duration 8 weeks    PT Treatment/Interventions ADLs/Self Care Home Management;Cryotherapy;Electrical Stimulation;Moist Heat;Traction;Ultrasound;Neuromuscular re-education;Balance training;Therapeutic exercise;Therapeutic activities;Functional mobility training;Stair training;Patient/family education;Manual techniques    PT Next Visit Plan Get report from MD.           Patient will benefit from skilled therapeutic intervention in order to improve the following deficits and impairments:  Abnormal gait, Decreased range of motion,  Difficulty walking, Increased fascial restricitons, Increased muscle spasms, Decreased activity tolerance, Pain, Improper body mechanics, Impaired flexibility, Decreased balance, Decreased mobility, Decreased strength  Visit Diagnosis: Acute right-sided low back pain with right-sided sciatica     Problem List Patient Active Problem List   Diagnosis Date Noted  . Thoracic aortic aneurysm without rupture ([place]) 05/19/2020  . Anemia 12/14/2019  . Right knee pain 10/07/2015  . COLONIC POLYPS, HX OF 12/09/2010  . ANEMIA, PERNICIOUS 06/17/2010  . HYPERLIPIDEMIA 12/05/2008  . HYPERTENSION 12/05/2008  . HYPERPLASIA PROSTATE UNS W/O UR OBST & OTH LUTS 12/05/2008  . FASTING HYPERGLYCEMIA 12/05/2008  . GOUT 12/04/2007    Troy House, Troy House 05/20/2020, 8:40 AM  [address] [place], [place], [address] Phone: [phone]   Fax:  [phone]  Name: Troy House MRN: [id number] Date of Birth: 02/27/1946

## 2020-05-23 ENCOUNTER — Ambulatory Visit: Payer: Medicare Other | Admitting: Physical Therapy

## 2020-05-27 ENCOUNTER — Ambulatory Visit: Payer: Medicare Other | Admitting: Physical Therapy

## 2020-05-28 NOTE — Progress Notes (Signed)
[address]       [place] [address]             [place] REPORT  Referring Provider is Deland Pretty, MD Primary Cardiologist is No primary care provider on file. PCP is Deland Pretty, MD  Chief Complaint  Patient presents with  . Thoracic Aortic Aneurysm    new patient consult, Cardiac CT- Calcium Score 03/10/20    HPI:  74 year old gentleman is is referred for evaluation of newly discovered thoracic aortic dilation.  His past medical history is notable for hyperlipidemia and gout.  He also takes medications for high blood pressure.  He underwent recent CT calcium scoring which demonstrated a mild dilation of his ascending aorta, measuring approximately 40 mm in maximal diameter.  This was done to evaluate a ill-defined history of chest pain with exertion.  He denies a family history of personal history of aneurysm.  Past Medical History:  Diagnosis Date  . Anemia    borderline, PMH of 2000 (B12,folate, iron panel WNL)   . B12 deficiency   . BPH (benign prostatic hypertrophy)   . Gout    PMH of (last flare 2006)  . Hx of colonic polyps    PMH of 2011  . Hyperglycemia   . Hyperlipidemia   . Hypertension   . Skin cancer    right ankle    Past Surgical History:  Procedure Laterality Date  . COLONOSCOPY  03/11/2015  . COLONOSCOPY W/ POLYPECTOMY  03/2010   tubular adenoma, Dr Deatra Ina  . ELBOW SURGERY     bilaterally for osteophytes  . KNEE ARTHROSCOPY     left  . lump removed     L breast- benign  . POLYPECTOMY    . REFRACTIVE SURGERY     OD X 2  . SHOULDER SURGERY  2013    Dr Dean/bone spurs right  . surgery for frozen shoulder  2004   left  . VASECTOMY      Family History  Problem Relation Age of Onset  . Hypertension Father   . Stroke Father 69  . Heart attack Father 53  . Lupus Sister   . Diabetes Sister        resolved post Bariatric surgery  . Thyroid cancer Sister   .  Hyperlipidemia Maternal Aunt   . Dementia Maternal Grandfather   . Cancer Maternal Grandfather        ? primary  . Mental illness Other        bipolar  . ADD / ADHD Other   . ADD / ADHD Other   . Colon polyps Neg Hx   . Colon cancer Neg Hx   . Esophageal cancer Neg Hx   . Stomach cancer Neg Hx   . Rectal cancer Neg Hx     Social History   Socioeconomic History  . Marital status: Married    Spouse name: Not on file  . Number of children: Not on file  . Years of education: Not on file  . Highest education level: Not on file  Occupational History  . Occupation: substitute Product manager: RETIRED  . Occupation: owns [employer]  Tobacco Use  . Smoking status: Former Smoker    Types: Cigarettes    Quit date: 11/08/1972    Years since quitting: 47.5  . Smokeless tobacco: Never Used  . Tobacco comment: smoked age [age] , up  to 3 ppd  Vaping Use  . Vaping Use: Never used  Substance and Sexual Activity  . Alcohol use: Yes    Alcohol/week: 7.0 - 14.0 standard drinks    Types: 7 - 14 Cans of beer per week  . Drug use: Yes    Types: Marijuana    Comment: once a month, last used 04/22/2020  . Sexual activity: Not on file  Other Topics Concern  . Not on file  Social History Narrative   Regular exercise- yes   Social Determinants of Health   Financial Resource Strain:   . Difficulty of Paying Living Expenses:   Food Insecurity:   . Worried About Charity fundraiser in the Last Year:   . Arboriculturist in the Last Year:   Transportation Needs:   . Film/video editor (Medical):   Marland Kitchen Lack of Transportation (Non-Medical):   Physical Activity:   . Days of Exercise per Week:   . Minutes of Exercise per Session:   Stress:   . Feeling of Stress :   Social Connections:   . Frequency of Communication with Friends and Family:   . Frequency of Social Gatherings with Friends and Family:   . Attends Religious Services:   . Active Member of Clubs or Organizations:    . Attends Archivist Meetings:   Marland Kitchen Marital Status:   Intimate Partner Violence:   . Fear of Current or Ex-Partner:   . Emotionally Abused:   Marland Kitchen Physically Abused:   . Sexually Abused:     Current Outpatient Medications  Medication Sig Dispense Refill  . aspirin 81 MG tablet Take 81 mg by mouth. 3 by mouth once daily    . carvedilol (COREG) 3.125 MG tablet     . Fluocinolone Acetonide 0.01 % OIL Place in ear(s) as needed. One drop in ear ear once daily, rx'ed by dermatologist    . furosemide (LASIX) 20 MG tablet     . gabapentin (NEURONTIN) 300 MG capsule 300 mg 2 (two) times daily.     . Ibuprofen 200 MG CAPS Take by mouth.    . NON FORMULARY Mega Food Blood Buider-Take one pill daily    . potassium chloride (KLOR-CON) 10 MEQ tablet     . ramipril (ALTACE) 10 MG capsule TAKE 1 CAPSULE (10 MG TOTAL) BY MOUTH DAILY. 30 capsule 0  . Riboflavin (VITAMIN B2 PO) Take by mouth.    . rosuvastatin (CRESTOR) 10 MG tablet     . traMADol (ULTRAM) 50 MG tablet As needed    . traZODone (DESYREL) 50 MG tablet      No current facility-administered medications for this visit.    Allergies  Allergen Reactions  . Hydrocodone     REACTION: LEG SWELLING AND  itching  . Penicillins     REACTION: LEG SWELLING AND ITCHING  . Codeine Itching and Swelling      Review of Systems:   General:  3 pound weight gain,   Cardiac:  Endorses chest pain with exertion,  + LE edema,  Respiratory:  No symptoms  GI:   History of colonoscopy last month  GU:   History of enlarged prostate,  Vascular:  Endorses leg cramps,  Neuro:   History of sciatica stroke, no prior history of stroke  Musculoskeletal: + arthritis,+ joint swelling, +difficulty walking,   Skin:   Negative  Psych:   Negative  Eyes:   History of cataract surgery  ENT:   Experiences  hearing loss,last saw dentist 1 year ago  Hematologic:  Negative  Endocrine:  No diabetes, does not check CBG's at home     Physical Exam:   BP  (!) 125/55 (BP Location: Right Arm, Patient Position: Sitting, Cuff Size: Normal)   Pulse 84   Temp 98.6 F (37 C)   Resp 18   Ht 5\' 10"  (1.778 m)   Wt 84.8 kg   SpO2 99% Comment: RA  BMI 26.83 kg/m   General:   well-appearing  HEENT:  Unremarkable   Neck:   no JVD, no bruits, no adenopathy  Chest:   clear to auscultation, symmetrical breath sounds, no wheezes, no rhonchi  CV:   RRR, no murmur   Abdomen:  soft, non-tender, no masses   Extremities:  warm, well-perfused, pulses intact, no LE edema  Rectal/GU  Deferred  Neuro:   Grossly non-focal and symmetrical throughout  Skin:   Clean and dry, no rashes, no breakdown   Diagnostic Tests:  I have personally reviewed his available imaging studies performed in the [facility] system on 03/10/2020 which demonstrates a small ascending aortic dilation measuring approximately 40 mm in maximal diameter.   Impression:  74 yo man with incidentally discovered mild ascending aortic dilation.  This could likely be safely followed as he is far away from requiring elective intervention.  In addition he is a very low risk for aortic catastrophe related to this dilation   Plan:  Follow up with thoracic surgery as needed Continue to monitor healthy BP Continue to avoid tobacco use    I spent in excess of 30 minutes during the conduct of this office consultation and >50% of this time involved direct face-to-face encounter with the patient for counseling and/or coordination of their care.          Level 3 Office Consult = 40 minutes         Level 4 Office Consult = 60 minutes         Level 5 Office Consult = 80 minutes   B. Murvin Natal, MD 05/28/2020 4:52 PM

## 2020-05-30 ENCOUNTER — Ambulatory Visit: Payer: Medicare Other | Admitting: Physical Therapy

## 2020-06-03 ENCOUNTER — Ambulatory Visit: Payer: Medicare Other | Admitting: Physical Therapy

## 2020-06-04 DIAGNOSIS — F5101 Primary insomnia: Secondary | ICD-10-CM | POA: Diagnosis not present

## 2020-06-04 DIAGNOSIS — I10 Essential (primary) hypertension: Secondary | ICD-10-CM | POA: Diagnosis not present

## 2020-06-06 ENCOUNTER — Ambulatory Visit: Payer: Medicare Other | Admitting: Physical Therapy

## 2020-06-10 DIAGNOSIS — H5202 Hypermetropia, left eye: Secondary | ICD-10-CM | POA: Diagnosis not present

## 2020-06-10 DIAGNOSIS — Z961 Presence of intraocular lens: Secondary | ICD-10-CM | POA: Diagnosis not present

## 2020-06-10 DIAGNOSIS — H5211 Myopia, right eye: Secondary | ICD-10-CM | POA: Diagnosis not present

## 2020-06-18 DIAGNOSIS — M21371 Foot drop, right foot: Secondary | ICD-10-CM | POA: Diagnosis not present

## 2020-06-18 DIAGNOSIS — M5441 Lumbago with sciatica, right side: Secondary | ICD-10-CM | POA: Diagnosis not present

## 2020-06-19 DIAGNOSIS — M21371 Foot drop, right foot: Secondary | ICD-10-CM | POA: Diagnosis not present

## 2020-06-19 DIAGNOSIS — M5417 Radiculopathy, lumbosacral region: Secondary | ICD-10-CM | POA: Diagnosis not present

## 2020-06-25 DIAGNOSIS — M4726 Other spondylosis with radiculopathy, lumbar region: Secondary | ICD-10-CM | POA: Diagnosis not present

## 2020-06-25 DIAGNOSIS — M48061 Spinal stenosis, lumbar region without neurogenic claudication: Secondary | ICD-10-CM | POA: Diagnosis not present

## 2020-07-10 DIAGNOSIS — M21371 Foot drop, right foot: Secondary | ICD-10-CM | POA: Diagnosis not present

## 2020-07-10 DIAGNOSIS — M5441 Lumbago with sciatica, right side: Secondary | ICD-10-CM | POA: Diagnosis not present

## 2020-07-10 DIAGNOSIS — M5417 Radiculopathy, lumbosacral region: Secondary | ICD-10-CM | POA: Diagnosis not present

## 2020-07-16 DIAGNOSIS — D649 Anemia, unspecified: Secondary | ICD-10-CM | POA: Diagnosis not present

## 2020-07-16 DIAGNOSIS — Z419 Encounter for procedure for purposes other than remedying health state, unspecified: Secondary | ICD-10-CM | POA: Diagnosis not present

## 2020-07-17 DIAGNOSIS — M2578 Osteophyte, vertebrae: Secondary | ICD-10-CM | POA: Diagnosis not present

## 2020-07-17 DIAGNOSIS — M21371 Foot drop, right foot: Secondary | ICD-10-CM | POA: Diagnosis not present

## 2020-07-17 DIAGNOSIS — R001 Bradycardia, unspecified: Secondary | ICD-10-CM | POA: Diagnosis not present

## 2020-07-17 DIAGNOSIS — M5416 Radiculopathy, lumbar region: Secondary | ICD-10-CM | POA: Diagnosis not present

## 2020-07-24 DIAGNOSIS — F5101 Primary insomnia: Secondary | ICD-10-CM | POA: Diagnosis not present

## 2020-07-24 DIAGNOSIS — I10 Essential (primary) hypertension: Secondary | ICD-10-CM | POA: Diagnosis not present

## 2020-08-14 DIAGNOSIS — Z4789 Encounter for other orthopedic aftercare: Secondary | ICD-10-CM | POA: Diagnosis not present

## 2020-08-14 DIAGNOSIS — Z880 Allergy status to penicillin: Secondary | ICD-10-CM | POA: Diagnosis not present

## 2020-08-14 DIAGNOSIS — Z885 Allergy status to narcotic agent status: Secondary | ICD-10-CM | POA: Diagnosis not present

## 2020-08-15 DIAGNOSIS — Z23 Encounter for immunization: Secondary | ICD-10-CM | POA: Diagnosis not present

## 2020-09-05 DIAGNOSIS — Z23 Encounter for immunization: Secondary | ICD-10-CM | POA: Diagnosis not present

## 2020-09-17 DIAGNOSIS — M5417 Radiculopathy, lumbosacral region: Secondary | ICD-10-CM | POA: Diagnosis not present

## 2020-09-17 DIAGNOSIS — M21371 Foot drop, right foot: Secondary | ICD-10-CM | POA: Diagnosis not present

## 2020-09-23 DIAGNOSIS — E7800 Pure hypercholesterolemia, unspecified: Secondary | ICD-10-CM | POA: Diagnosis not present

## 2020-09-23 DIAGNOSIS — I10 Essential (primary) hypertension: Secondary | ICD-10-CM | POA: Diagnosis not present

## 2020-09-23 DIAGNOSIS — R7303 Prediabetes: Secondary | ICD-10-CM | POA: Diagnosis not present

## 2020-09-23 DIAGNOSIS — F5101 Primary insomnia: Secondary | ICD-10-CM | POA: Diagnosis not present

## 2020-10-07 DIAGNOSIS — S99921A Unspecified injury of right foot, initial encounter: Secondary | ICD-10-CM | POA: Diagnosis not present

## 2020-10-07 DIAGNOSIS — M7989 Other specified soft tissue disorders: Secondary | ICD-10-CM | POA: Diagnosis not present

## 2020-10-07 DIAGNOSIS — M21371 Foot drop, right foot: Secondary | ICD-10-CM | POA: Diagnosis not present

## 2020-10-07 DIAGNOSIS — Z9889 Other specified postprocedural states: Secondary | ICD-10-CM | POA: Diagnosis not present

## 2020-10-07 DIAGNOSIS — S99921D Unspecified injury of right foot, subsequent encounter: Secondary | ICD-10-CM | POA: Diagnosis not present

## 2021-01-13 ENCOUNTER — Telehealth: Payer: Self-pay

## 2021-01-13 NOTE — Telephone Encounter (Signed)
NOTES ON FILE FROM [facility] [phone], SENT REFERRAL TO SCHEDULING 

## 2021-01-20 DIAGNOSIS — M1812 Unilateral primary osteoarthritis of first carpometacarpal joint, left hand: Secondary | ICD-10-CM | POA: Diagnosis not present

## 2021-01-20 DIAGNOSIS — M720 Palmar fascial fibromatosis [Dupuytren]: Secondary | ICD-10-CM | POA: Diagnosis not present

## 2021-03-05 DIAGNOSIS — I10 Essential (primary) hypertension: Secondary | ICD-10-CM | POA: Diagnosis not present

## 2021-03-05 DIAGNOSIS — Z125 Encounter for screening for malignant neoplasm of prostate: Secondary | ICD-10-CM | POA: Diagnosis not present

## 2021-03-05 DIAGNOSIS — Z0000 Encounter for general adult medical examination without abnormal findings: Secondary | ICD-10-CM | POA: Diagnosis not present

## 2021-03-05 DIAGNOSIS — E7800 Pure hypercholesterolemia, unspecified: Secondary | ICD-10-CM | POA: Diagnosis not present

## 2021-03-10 ENCOUNTER — Other Ambulatory Visit: Payer: Self-pay | Admitting: Internal Medicine

## 2021-03-10 DIAGNOSIS — Z0000 Encounter for general adult medical examination without abnormal findings: Secondary | ICD-10-CM | POA: Diagnosis not present

## 2021-03-10 DIAGNOSIS — M7989 Other specified soft tissue disorders: Secondary | ICD-10-CM | POA: Diagnosis not present

## 2021-03-10 DIAGNOSIS — I2510 Atherosclerotic heart disease of native coronary artery without angina pectoris: Secondary | ICD-10-CM | POA: Diagnosis not present

## 2021-03-10 DIAGNOSIS — M109 Gout, unspecified: Secondary | ICD-10-CM | POA: Diagnosis not present

## 2021-03-10 DIAGNOSIS — N401 Enlarged prostate with lower urinary tract symptoms: Secondary | ICD-10-CM | POA: Diagnosis not present

## 2021-03-10 DIAGNOSIS — R7303 Prediabetes: Secondary | ICD-10-CM | POA: Diagnosis not present

## 2021-03-10 DIAGNOSIS — I712 Thoracic aortic aneurysm, without rupture, unspecified: Secondary | ICD-10-CM

## 2021-03-10 DIAGNOSIS — E7800 Pure hypercholesterolemia, unspecified: Secondary | ICD-10-CM | POA: Diagnosis not present

## 2021-03-10 DIAGNOSIS — I10 Essential (primary) hypertension: Secondary | ICD-10-CM | POA: Diagnosis not present

## 2021-03-26 DIAGNOSIS — I712 Thoracic aortic aneurysm, without rupture: Secondary | ICD-10-CM | POA: Diagnosis not present

## 2021-03-26 DIAGNOSIS — I790 Aneurysm of aorta in diseases classified elsewhere: Secondary | ICD-10-CM | POA: Diagnosis not present

## 2021-03-31 ENCOUNTER — Ambulatory Visit
Admission: RE | Admit: 2021-03-31 | Discharge: 2021-03-31 | Disposition: A | Discharge status: Home / Self Care | Payer: Medicare Other | Source: Ambulatory Visit | Attending: Internal Medicine | Admitting: Internal Medicine

## 2021-03-31 ENCOUNTER — Other Ambulatory Visit: Payer: Self-pay

## 2021-03-31 DIAGNOSIS — I712 Thoracic aortic aneurysm, without rupture, unspecified: Secondary | ICD-10-CM

## 2021-03-31 DIAGNOSIS — I2510 Atherosclerotic heart disease of native coronary artery without angina pectoris: Secondary | ICD-10-CM | POA: Diagnosis not present

## 2021-03-31 DIAGNOSIS — M47819 Spondylosis without myelopathy or radiculopathy, site unspecified: Secondary | ICD-10-CM | POA: Diagnosis not present

## 2021-03-31 DIAGNOSIS — I700 Atherosclerosis of aorta: Secondary | ICD-10-CM | POA: Diagnosis not present

## 2021-03-31 MED ORDER — IOPAMIDOL (ISOVUE-370) INJECTION 76%
75.0000 mL | Freq: Once | INTRAVENOUS | Status: AC | PRN
  Administered 2021-03-31: 75 mL via INTRAVENOUS

## 2021-04-01 DIAGNOSIS — I10 Essential (primary) hypertension: Secondary | ICD-10-CM | POA: Diagnosis not present

## 2021-04-09 DIAGNOSIS — I2510 Atherosclerotic heart disease of native coronary artery without angina pectoris: Secondary | ICD-10-CM | POA: Diagnosis not present

## 2021-04-09 DIAGNOSIS — I10 Essential (primary) hypertension: Secondary | ICD-10-CM | POA: Diagnosis not present

## 2021-04-29 DIAGNOSIS — Z201 Contact with and (suspected) exposure to tuberculosis: Secondary | ICD-10-CM | POA: Diagnosis not present

## 2021-04-29 DIAGNOSIS — I10 Essential (primary) hypertension: Secondary | ICD-10-CM | POA: Diagnosis not present

## 2021-05-02 DIAGNOSIS — R222 Localized swelling, mass and lump, trunk: Secondary | ICD-10-CM | POA: Diagnosis not present

## 2021-05-02 DIAGNOSIS — M549 Dorsalgia, unspecified: Secondary | ICD-10-CM | POA: Diagnosis not present

## 2021-05-14 ENCOUNTER — Institutional Professional Consult (permissible substitution) (INDEPENDENT_AMBULATORY_CARE_PROVIDER_SITE_OTHER): Payer: Medicare Other | Admitting: Cardiothoracic Surgery

## 2021-05-14 ENCOUNTER — Other Ambulatory Visit: Payer: Self-pay

## 2021-05-14 VITALS — BP 121/69 | HR 55 | Resp 20 | Ht 70.0 in | Wt 186.0 lb

## 2021-05-14 DIAGNOSIS — Z86018 Personal history of other benign neoplasm: Secondary | ICD-10-CM | POA: Diagnosis not present

## 2021-05-14 DIAGNOSIS — L570 Actinic keratosis: Secondary | ICD-10-CM | POA: Diagnosis not present

## 2021-05-14 DIAGNOSIS — I712 Thoracic aortic aneurysm, without rupture, unspecified: Secondary | ICD-10-CM

## 2021-05-14 DIAGNOSIS — Z85828 Personal history of other malignant neoplasm of skin: Secondary | ICD-10-CM | POA: Diagnosis not present

## 2021-05-14 DIAGNOSIS — L821 Other seborrheic keratosis: Secondary | ICD-10-CM | POA: Diagnosis not present

## 2021-05-14 DIAGNOSIS — D225 Melanocytic nevi of trunk: Secondary | ICD-10-CM | POA: Diagnosis not present

## 2021-05-14 DIAGNOSIS — Q825 Congenital non-neoplastic nevus: Secondary | ICD-10-CM | POA: Diagnosis not present

## 2021-05-14 DIAGNOSIS — D2372 Other benign neoplasm of skin of left lower limb, including hip: Secondary | ICD-10-CM | POA: Diagnosis not present

## 2021-05-14 DIAGNOSIS — L578 Other skin changes due to chronic exposure to nonionizing radiation: Secondary | ICD-10-CM | POA: Diagnosis not present

## 2021-05-14 NOTE — Progress Notes (Signed)
[address]       [place] [address]             [phone]     CARDIOTHORACIC SURGERY office visitation  Referring Provider is Deland Pretty, MD Primary Cardiologist is None PCP is Deland Pretty, MD  Chief Complaint  Patient presents with   Thoracic Aortic Aneurysm    Surgical consult    HPI:  75 year old man presents for assessment of incidentally discovered ascending aortic aneurysm.  His underlying risk factors include hypertension.  He underwent a CT scan in May of this year which demonstrated a 4 cm ascending aortic dilation.  He has no prior history of aneurysms personally or in his family.  Past Medical History:  Diagnosis Date   Anemia    borderline, PMH of 2000 (B12,folate, iron panel WNL)    B12 deficiency    BPH (benign prostatic hypertrophy)    Gout    PMH of (last flare 2006)   Hx of colonic polyps    PMH of 2011   Hyperglycemia    Hyperlipidemia    Hypertension    Skin cancer    right ankle    Past Surgical History:  Procedure Laterality Date   COLONOSCOPY  03/11/2015   COLONOSCOPY W/ POLYPECTOMY  03/2010   tubular adenoma, Dr Deatra Ina   ELBOW SURGERY     bilaterally for osteophytes   KNEE ARTHROSCOPY     left   lump removed     L breast- benign   POLYPECTOMY     REFRACTIVE SURGERY     OD X 2   SHOULDER SURGERY  2013    Dr Venetia Maxon spurs right   surgery for frozen shoulder  2004   left   VASECTOMY      Family History  Problem Relation Age of Onset   Hypertension Father    Stroke Father 5   Heart attack Father 64   Lupus Sister    Diabetes Sister        resolved post Bariatric surgery   Thyroid cancer Sister    Hyperlipidemia Maternal Aunt    Dementia Maternal Grandfather    Cancer Maternal Grandfather        ? primary   Mental illness Other        bipolar   ADD / ADHD Other    ADD / ADHD Other    Colon polyps Neg Hx    Colon cancer Neg Hx    Esophageal cancer Neg Hx    Stomach cancer Neg Hx     Rectal cancer Neg Hx     Social History   Socioeconomic History   Marital status: Married    Spouse name: Not on file   Number of children: Not on file   Years of education: Not on file   Highest education level: Not on file  Occupational History   Occupation: substitute Product manager: RETIRED   Occupation: owns Theatre manager business  Tobacco Use   Smoking status: Former    Pack years: 0.00    Types: Cigarettes    Quit date: 11/08/1972    Years since quitting: 48.5   Smokeless tobacco: Never   Tobacco comments:    smoked age [age] , up to 3 ppd  Vaping Use   Vaping Use: Never used  Substance and Sexual Activity   Alcohol use: Yes    Alcohol/week: 7.0 - 14.0 standard drinks    Types:  7 - 14 Cans of beer per week   Drug use: Yes    Types: Marijuana    Comment: once a month, last used 04/22/2020   Sexual activity: Not on file  Other Topics Concern   Not on file  Social History Narrative   Regular exercise- yes   Social Determinants of Health   Financial Resource Strain: Not on file  Food Insecurity: Not on file  Transportation Needs: Not on file  Physical Activity: Not on file  Stress: Not on file  Social Connections: Not on file  Intimate Partner Violence: Not on file    Current Outpatient Medications  Medication Sig Dispense Refill   aspirin 81 MG tablet Take 81 mg by mouth. 3 by mouth once daily     carvedilol (COREG) 3.125 MG tablet      Fluocinolone Acetonide 0.01 % OIL Place in ear(s) as needed. One drop in ear ear once daily, rx'ed by dermatologist     furosemide (LASIX) 20 MG tablet      gabapentin (NEURONTIN) 300 MG capsule 300 mg 2 (two) times daily.      Ibuprofen 200 MG CAPS Take by mouth.     NON FORMULARY Mega Food Blood Buider-Take one pill daily     potassium chloride (KLOR-CON) 10 MEQ tablet      ramipril (ALTACE) 10 MG capsule TAKE 1 CAPSULE (10 MG TOTAL) BY MOUTH DAILY. 30 capsule 0   Riboflavin (VITAMIN B2 PO) Take by mouth.      rosuvastatin (CRESTOR) 10 MG tablet      traMADol (ULTRAM) 50 MG tablet As needed     traZODone (DESYREL) 50 MG tablet      No current facility-administered medications for this visit.    Allergies  Allergen Reactions   Hydrocodone     REACTION: LEG SWELLING AND  itching   Penicillins     REACTION: LEG SWELLING AND ITCHING   Codeine Itching and Swelling      Review of Systems:   General:  No weight change or change in energy level  Cardiac:  Occasional shortness of breath; history of leg swelling right greater than left  Respiratory:  Shortness of breath  GI:   Negative  GU:   No prostate or kidney disease  Vascular:  No claudication or venous disease  Neuro:   History of peripheral neuropathy  Musculoskeletal: History of back surgery  Skin:   Negative  Psych:   Negative  Eyes:   Negative  ENT:   last saw dentist past month  Hematologic:  Negative  Endocrine:  No diabetes or thyroid disease     Physical Exam:   BP 121/69   Pulse (!) 55   Resp 20   Ht 5\' 10"  (1.778 m)   Wt 84.4 kg   SpO2 97% Comment: RA  BMI 26.69 kg/m   General:    well-appearing  HEENT:  Unremarkable   Neck:   no JVD, no bruits, no adenopathy   Chest:   clear to auscultation, symmetrical breath sounds, no wheezes, no rhonchi   CV:   RRR, no detectable murmur   Abdomen:  soft, non-tender, no masses   Extremities:  warm, well-perfused, pulses intact throughout, no LE edema  Rectal/GU  Deferred  Neuro:   Grossly non-focal and symmetrical throughout  Skin:   Clean and dry, no rashes, no breakdown   Diagnostic Tests:  I personally reviewed his CT scan of the chest from 03/31/2021 which demonstrates at most  4 cm ascending aortic diameter.   Impression:  75 year old man with aortic ectasia which is nonthreatening    Plan:  Continue blood pressure control as he is doing Follow-up as needed   I spent in excess of 20 minutes during the conduct of this office consultation and >50% of  this time involved direct face-to-face encounter with the patient for counseling and/or coordination of their care.          Level 3 Office Consult = 40 minutes         Level 4 Office Consult = 60 minutes         Level 5 Office Consult = 80 minutes  B.  Murvin Natal, MD 05/14/2021 11:24 AM

## 2021-05-26 DIAGNOSIS — Z23 Encounter for immunization: Secondary | ICD-10-CM | POA: Diagnosis not present

## 2021-05-26 DIAGNOSIS — R0609 Other forms of dyspnea: Secondary | ICD-10-CM | POA: Diagnosis not present

## 2021-05-26 DIAGNOSIS — I10 Essential (primary) hypertension: Secondary | ICD-10-CM | POA: Diagnosis not present

## 2021-05-26 DIAGNOSIS — I700 Atherosclerosis of aorta: Secondary | ICD-10-CM | POA: Diagnosis not present

## 2021-06-16 DIAGNOSIS — Z23 Encounter for immunization: Secondary | ICD-10-CM | POA: Diagnosis not present

## 2021-06-16 DIAGNOSIS — I10 Essential (primary) hypertension: Secondary | ICD-10-CM | POA: Diagnosis not present

## 2021-06-16 DIAGNOSIS — U071 COVID-19: Secondary | ICD-10-CM | POA: Diagnosis not present

## 2021-10-09 DIAGNOSIS — Z23 Encounter for immunization: Secondary | ICD-10-CM | POA: Diagnosis not present

## 2021-11-11 DIAGNOSIS — Z20822 Contact with and (suspected) exposure to covid-19: Secondary | ICD-10-CM | POA: Diagnosis not present

## 2021-12-15 IMAGING — CT CT ANGIO CHEST
1 series · 19 of 32 positions shown · IV contrast (APPLIED)
Comparison: None available

CLINICAL DATA: Thoracic aortic aneurysm up to 4 cm

EXAM:
CT ANGIOGRAPHY CHEST WITH CONTRAST
TECHNIQUE: Multidetector CT imaging of the chest was performed using the
standard protocol during bolus administration of intravenous
contrast. Multiplanar CT image reconstructions and MIPs were
obtained to evaluate the vascular anatomy.
CONTRAST:  75mL GCSM2L-JIB IOPAMIDOL (GCSM2L-JIB) INJECTION 76%

[Series 4: chest angio · axial · 0.83mm/px · z∈[-389,-68]mm · 19 of 115 slices shown]
[im 4/115  lung]
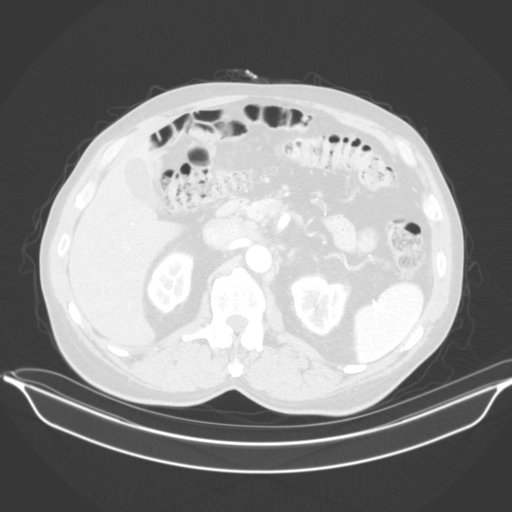
[im 12/115  soft-tissue]
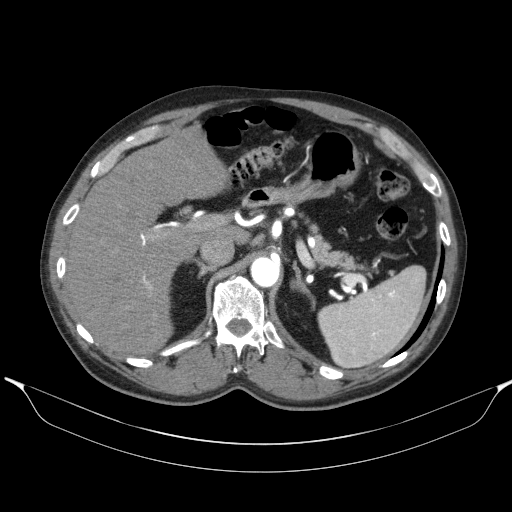
[im 15/115  lung]
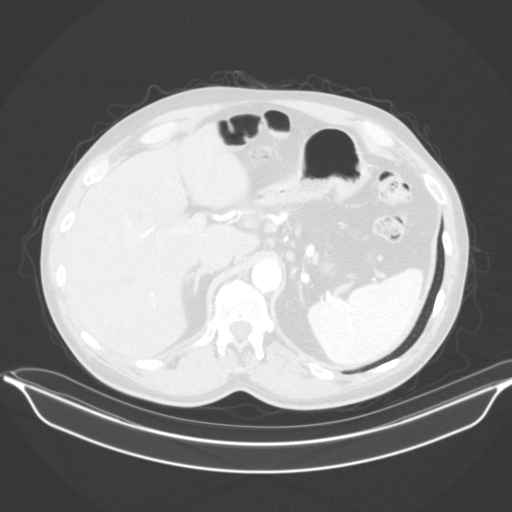
[im 23/115  soft-tissue]
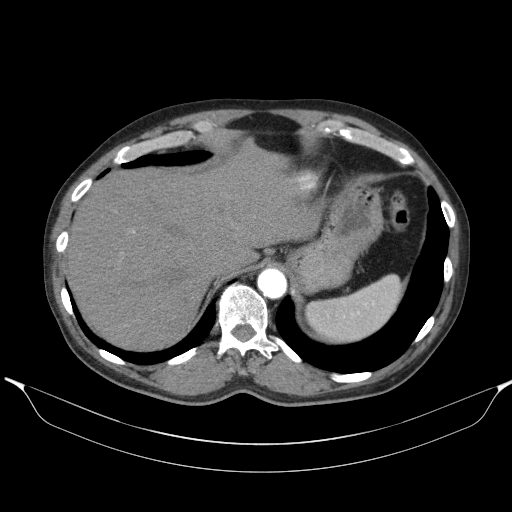
[im 30/115  lung]
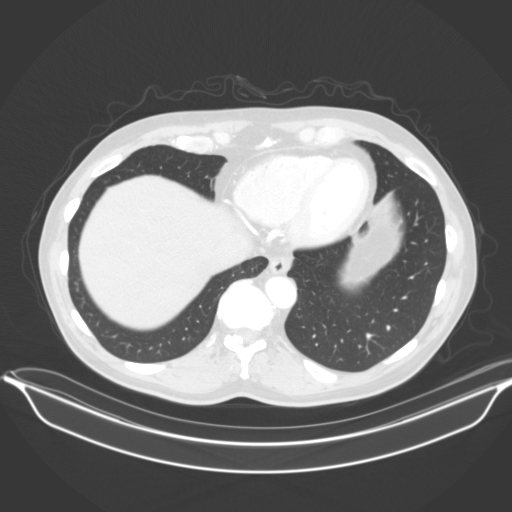
[im 34/115  soft-tissue]
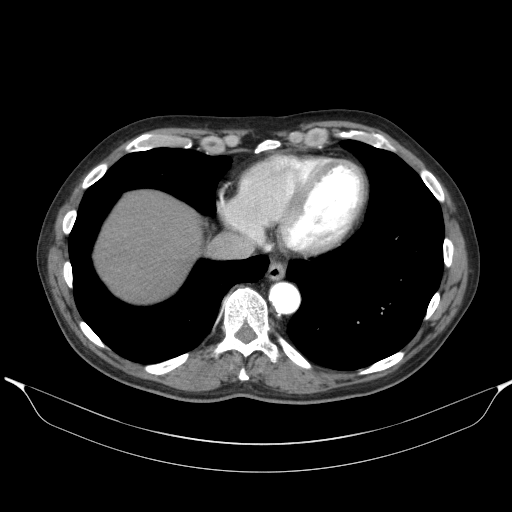
[im 41/115  lung]
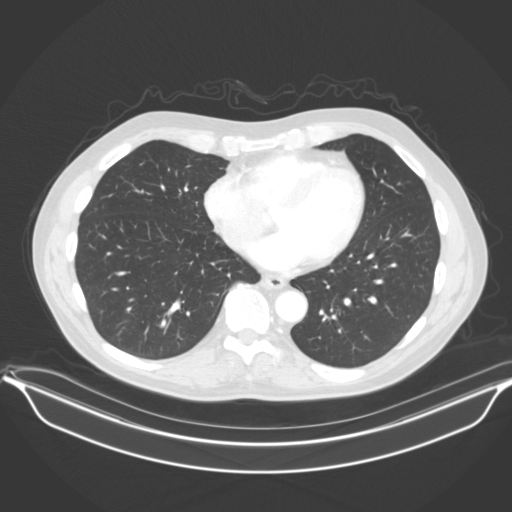
[im 45/115  soft-tissue]
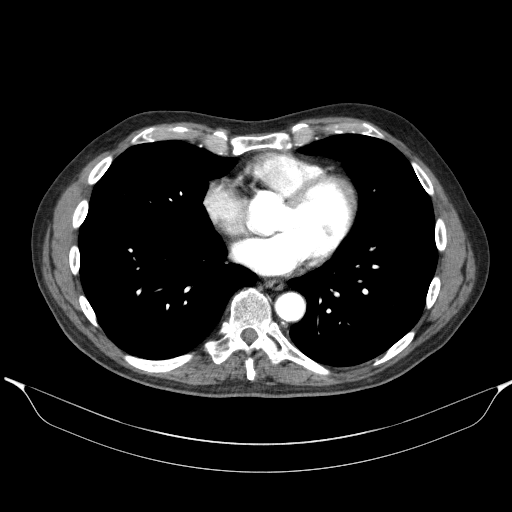
[im 52/115  lung]
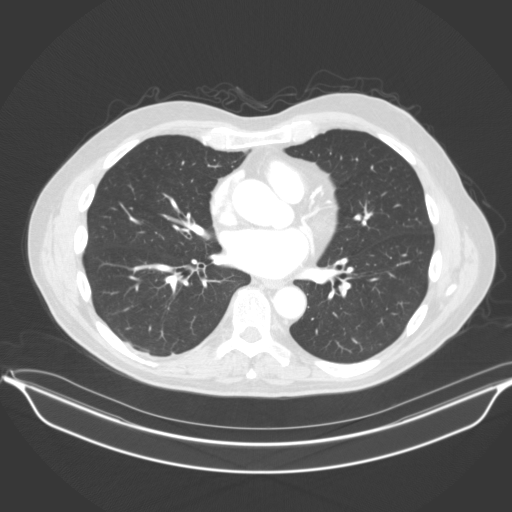
[im 59/115  soft-tissue]
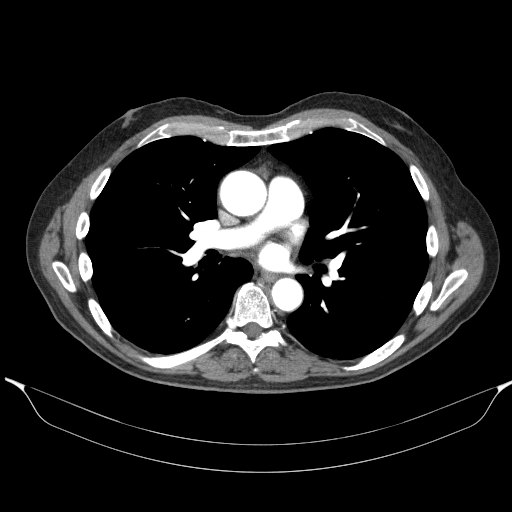
[im 63/115  lung]
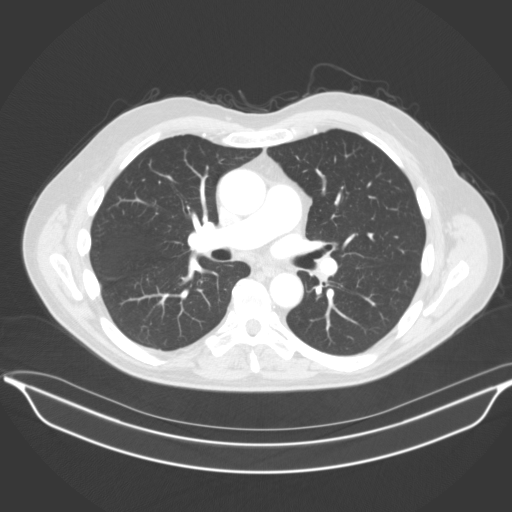
[im 70/115  soft-tissue]
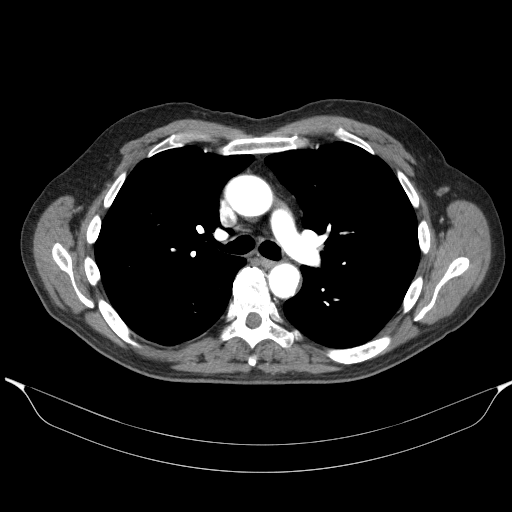
[im 74/115  lung]
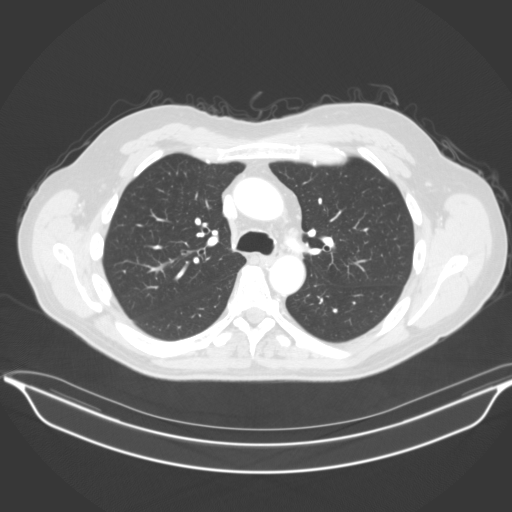
[im 81/115  soft-tissue]
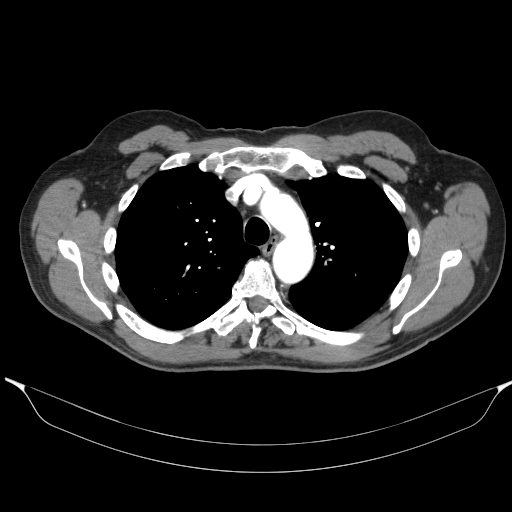
[im 85/115  lung]
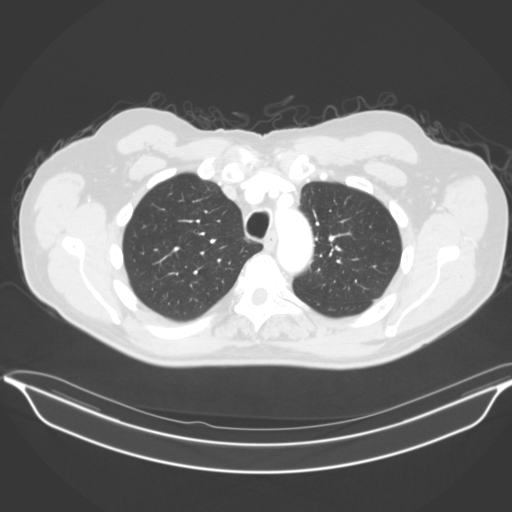
[im 92/115  soft-tissue]
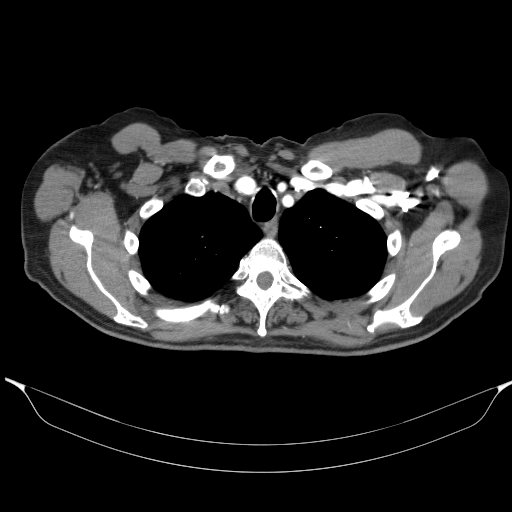
[im 100/115  lung]
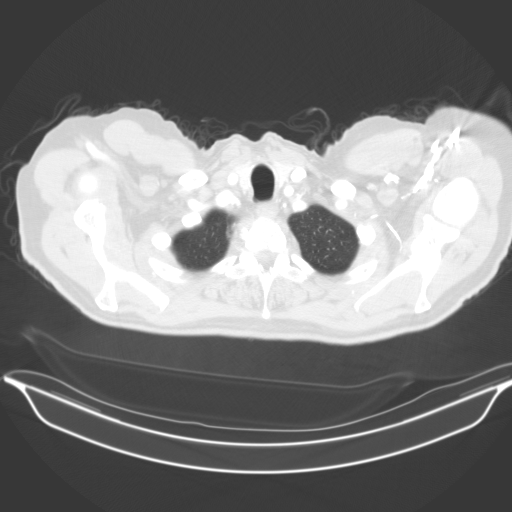
[im 103/115  soft-tissue]
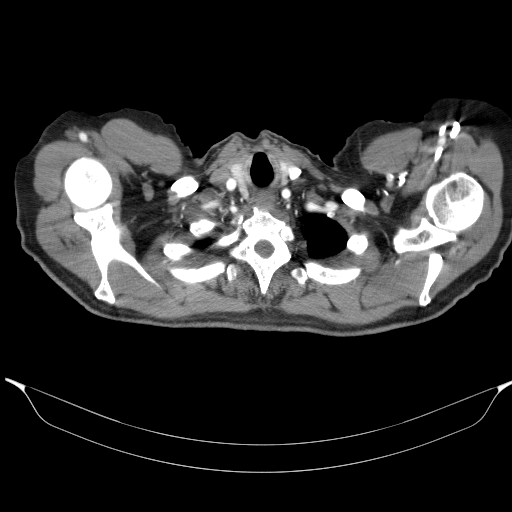
[im 111/115  lung]
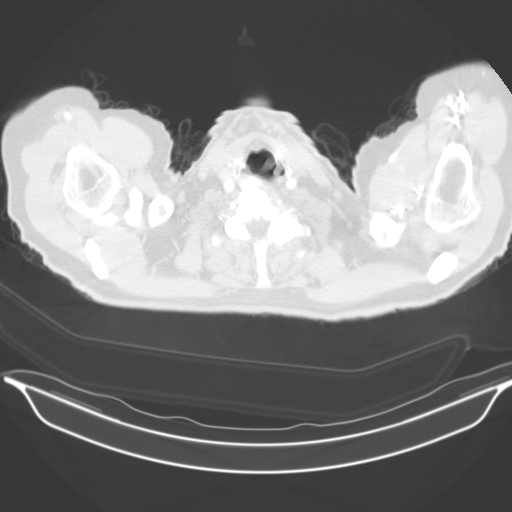

[19 of 32 positions shown; findings below may reference images not displayed]

FINDINGS: Cardiovascular: Mild fusiform dilatation of the ascending thoracic
aorta, maximal diameter 3.8 cm. Remainder of the thoracic aorta is
normal in caliber. No associated acute vascular finding or
dissection. No mediastinal hemorrhage or hematoma.

Pulmonary arteries are well visualized and patent as well as normal
in caliber. No significant filling defect or pulmonary embolus by
CTA.

Patent 3 vessel arch anatomy. Normal heart size. Native coronary
atherosclerosis noted. No pericardial effusion. Pulmonary veins
appear patent. No anomalous venous return. Central innominate veins
and SVC are patent.

Mediastinum/Nodes: No enlarged mediastinal, hilar, or axillary lymph
nodes. Thyroid gland, trachea, and esophagus demonstrate no
significant findings.

Lungs/Pleura: Minor right lower lobe subpleural scarring or
atelectasis. No acute airspace process, collapse or consolidation.
No interstitial process or edema. Trachea and central airways are
patent.

No pleural abnormality, effusion or pneumothorax.

Upper Abdomen: No acute abnormality.

Musculoskeletal: Minor degenerative changes of the spine. No acute
osseous finding. No compression fracture. Sternum intact.

Review of the MIP images confirms the above findings.
IMPRESSION: Mild fusiform ectasia of the ascending thoracic aorta, maximal
diameter 3.8 cm. Negative for significant aneurysm.

No other acute intrathoracic vascular finding.

Native coronary atherosclerosis

Aortic Atherosclerosis ([id number]-ZW4.4).

## 2022-02-01 ENCOUNTER — Ambulatory Visit (INDEPENDENT_AMBULATORY_CARE_PROVIDER_SITE_OTHER): Payer: Medicare Other

## 2022-02-01 ENCOUNTER — Other Ambulatory Visit: Payer: Self-pay | Admitting: Physician Assistant

## 2022-02-01 ENCOUNTER — Other Ambulatory Visit: Payer: Self-pay

## 2022-02-01 ENCOUNTER — Ambulatory Visit (INDEPENDENT_AMBULATORY_CARE_PROVIDER_SITE_OTHER): Payer: Medicare Other | Admitting: Physician Assistant

## 2022-02-01 VITALS — Ht 70.0 in | Wt 174.0 lb

## 2022-02-01 DIAGNOSIS — M25561 Pain in right knee: Secondary | ICD-10-CM

## 2022-02-01 DIAGNOSIS — S82409A Unspecified fracture of shaft of unspecified fibula, initial encounter for closed fracture: Secondary | ICD-10-CM | POA: Insufficient documentation

## 2022-02-01 NOTE — Progress Notes (Signed)
? ?Office Visit Note ?  ?Patient: Troy House           ?Date of Birth: 02-Sep-1946           ?MRN: [id number] ?Visit Date: 02/01/2022 ?             ?Requested by: Deland Pretty, MD ?[place],  [place] [address] ?PCP: Deland Pretty, MD ? ?Chief Complaint  ?Patient presents with  ? Right Knee - Pain  ? ? ? ? ?HPI: ?Troy House is a pleasant 76 year old gentleman who is 5 days status post fall.  He says that every 9 to 12 months his whole body just gives out and he falls.  He has a history of spine surgery 2 years ago.  He has had problems limbs with weakness and numbness in the right leg since.  From his explanation he has worn an AFO in the past.  He said when he fell this time his body simply gave out.  He is pointing pain along the lateral aspect of his right lower leg. ? ?Assessment & Plan: ?Visit Diagnoses:  ?1. Acute pain of right knee   ? ? ?Plan: X-rays and exam consistent with oblique fracture of the fibular shaft.  He does have some arthritis in his knee but do not see any acute injuries there.  He does have a follow-up with a neurologist to define the cause of these falls.  While intermittent they obviously are concerning.  He does have slight weakness with dorsiflexion in the right ankle but this is consistent with his previous history and unchanged.  We have placed him in a cam boot and will follow-up in 2 weeks.  Should call if he ends up more painful ? ?Follow-Up Instructions: No follow-ups on file.  ? ?Ortho Exam ? ?Patient is alert, oriented, no adenopathy, well-dressed, normal affect, normal respiratory effort. ?Examination of the right lower extremity pulses are palpable.  He has tenderness over the lateral side of his leg.  He has 5 out of 5 strength with plantarflexion 3 out of 5 strength with dorsiflexion which per him is his baseline.  Good inversion eversion strength.  He has no tenderness or swelling over the ankle.  No tenderness over the syndesmosis.  His  compartments are soft and compressible he is focally tender to palpation over the area of the coordinated fracture ? ?Imaging: ?No results found. ?No images are attached to the encounter. ? ?Labs: ?Lab Results  ?Component Value Date  ? HGBA1C 5.9 01/26/2013  ? HGBA1C 6.1 12/16/2011  ? HGBA1C 6.0 12/09/2010  ? LABURIC 5.5 01/25/2013  ? LABURIC 5.6 12/16/2011  ? LABURIC 6.4 12/09/2010  ? ? ? ?Lab Results  ?Component Value Date  ? ALBUMIN 4.6 01/25/2013  ? ALBUMIN 4.3 12/16/2011  ? ALBUMIN 4.4 12/09/2010  ? ? ?No results found for: MG ?No results found for: VD25OH ? ?No results found for: PREALBUMIN ? ?  Latest Ref Rng & Units 01/25/2013  ? 10:33 AM 12/16/2011  ?  8:26 AM 12/09/2010  ?  9:16 AM  ?CBC EXTENDED  ?WBC 4.5 - 10.5 K/uL 5.9   5.5   5.6    ?RBC 4.22 - 5.81 Mil/uL 4.18   3.87   3.74    ?Hemoglobin 13.0 - 17.0 g/dL 12.9   12.5   12.4    ?HCT 39.0 - 52.0 % 38.4   36.8   35.6    ?Platelets 150.0 - 400.0 K/uL 247.0  232.0   244.0    ?NEUT# 1.4 - 7.7 K/uL 3.2   3.0   3.1    ?Lymph# 0.7 - 4.0 K/uL 2.1   1.9   2.0    ? ? ? ?Body mass index is 24.97 kg/m?. ? ?Orders:  ?No orders of the defined types were placed in this encounter. ? ?No orders of the defined types were placed in this encounter. ? ? ? Procedures: ?No procedures performed ? ?Clinical Data: ?No additional findings. ? ?ROS: ? ?All other systems negative, except as noted in the HPI. ?Review of Systems ? ?Objective: ?Vital Signs: Ht '5\' 10"'$  (1.778 m)   Wt 174 lb (78.9 kg)   BMI 24.97 kg/m?  ? ?Specialty Comments:  ?No specialty comments available. ? ?PMFS History: ?Patient Active Problem List  ? Diagnosis Date Noted  ? Closed fracture of fibula, shaft 02/01/2022  ? Thoracic aortic aneurysm without rupture 05/19/2020  ? Anemia 12/14/2019  ? Right knee pain 10/07/2015  ? COLONIC POLYPS, HX OF 12/09/2010  ? ANEMIA, PERNICIOUS 06/17/2010  ? HYPERLIPIDEMIA 12/05/2008  ? HYPERTENSION 12/05/2008  ? HYPERPLASIA PROSTATE UNS W/O UR OBST & OTH LUTS 12/05/2008  ? FASTING  HYPERGLYCEMIA 12/05/2008  ? GOUT 12/04/2007  ? ?Past Medical History:  ?Diagnosis Date  ? Anemia   ? borderline, PMH of 2000 (B12,folate, iron panel WNL)   ? B12 deficiency   ? BPH (benign prostatic hypertrophy)   ? Gout   ? PMH of (last flare 2006)  ? Hx of colonic polyps   ? PMH of 2011  ? Hyperglycemia   ? Hyperlipidemia   ? Hypertension   ? Skin cancer   ? right ankle  ?  ?Family History  ?Problem Relation Age of Onset  ? Hypertension Father   ? Stroke Father 62  ? Heart attack Father 59  ? Lupus Sister   ? Diabetes Sister   ?     resolved post Bariatric surgery  ? Thyroid cancer Sister   ? Hyperlipidemia Maternal Aunt   ? Dementia Maternal Grandfather   ? Cancer Maternal Grandfather   ?     ? primary  ? Mental illness Other   ?     bipolar  ? ADD / ADHD Other   ? ADD / ADHD Other   ? Colon polyps Neg Hx   ? Colon cancer Neg Hx   ? Esophageal cancer Neg Hx   ? Stomach cancer Neg Hx   ? Rectal cancer Neg Hx   ?  ?Past Surgical History:  ?Procedure Laterality Date  ? COLONOSCOPY  03/11/2015  ? COLONOSCOPY W/ POLYPECTOMY  03/2010  ? tubular adenoma, Dr Deatra Ina  ? ELBOW SURGERY    ? bilaterally for osteophytes  ? KNEE ARTHROSCOPY    ? left  ? lump removed    ? L breast- benign  ? POLYPECTOMY    ? REFRACTIVE SURGERY    ? OD X 2  ? SHOULDER SURGERY  2013  ?  Dr Venetia Maxon spurs right  ? surgery for frozen shoulder  2004  ? left  ? VASECTOMY    ? ?Social History  ? ?Occupational History  ? Occupation: Oceanographer  ?  Employer: RETIRED  ? Occupation: owns Theatre manager business  ?Tobacco Use  ? Smoking status: Former  ?  Types: Cigarettes  ?  Quit date: 11/08/1972  ?  Years since quitting: 49.2  ? Smokeless tobacco: Never  ? Tobacco comments:  ?  smoked age [age] ,  up to 3 ppd  ?Vaping Use  ? Vaping Use: Never used  ?Substance and Sexual Activity  ? Alcohol use: Yes  ?  Alcohol/week: 7.0 - 14.0 standard drinks  ?  Types: 7 - 14 Cans of beer per week  ? Drug use: Yes  ?  Types: Marijuana  ?  Comment: once a month, last  used 04/22/2020  ? Sexual activity: Not on file  ? ? ? ? ? ?

## 2022-02-04 ENCOUNTER — Telehealth: Payer: Self-pay | Admitting: Orthopedic Surgery

## 2022-02-04 NOTE — Telephone Encounter (Signed)
Called and spoke with patient. He said that the boot causes more pain to walk with. I offered for him to come in for a knee immobilizer. He would prefer to just go without bracing. He feels better walking without a boot and does not want his whole leg immobilized. I told him I would let Bevely Palmer know and if this is a problem then I would call him back. ?

## 2022-02-04 NOTE — Telephone Encounter (Signed)
Mr. Trauger called in to let us know about a problem he is having with his fx boot.  He saw Bevely Palmer yesterday and she gave him a boot for his leg fx.  He states that the "rigid" top of the boot is at the same level as his fracture.  It is causing him more pain to wear the boot.  He would like to know if he needs a higher boot or something different? Please call at [phone]. ?

## 2022-02-17 ENCOUNTER — Ambulatory Visit: Payer: Medicare Other | Admitting: Orthopaedic Surgery

## 2022-02-17 DIAGNOSIS — R29898 Other symptoms and signs involving the musculoskeletal system: Secondary | ICD-10-CM | POA: Diagnosis not present

## 2022-02-17 DIAGNOSIS — Z9889 Other specified postprocedural states: Secondary | ICD-10-CM | POA: Diagnosis not present

## 2022-02-17 DIAGNOSIS — R296 Repeated falls: Secondary | ICD-10-CM | POA: Diagnosis not present

## 2022-02-27 DIAGNOSIS — M47814 Spondylosis without myelopathy or radiculopathy, thoracic region: Secondary | ICD-10-CM | POA: Diagnosis not present

## 2022-02-27 DIAGNOSIS — R29898 Other symptoms and signs involving the musculoskeletal system: Secondary | ICD-10-CM | POA: Diagnosis not present

## 2022-02-27 DIAGNOSIS — M5134 Other intervertebral disc degeneration, thoracic region: Secondary | ICD-10-CM | POA: Diagnosis not present

## 2022-02-27 DIAGNOSIS — M4802 Spinal stenosis, cervical region: Secondary | ICD-10-CM | POA: Diagnosis not present

## 2022-02-27 DIAGNOSIS — M47812 Spondylosis without myelopathy or radiculopathy, cervical region: Secondary | ICD-10-CM | POA: Diagnosis not present

## 2022-02-27 DIAGNOSIS — R296 Repeated falls: Secondary | ICD-10-CM | POA: Diagnosis not present

## 2022-03-04 ENCOUNTER — Encounter: Payer: Self-pay | Admitting: Orthopaedic Surgery

## 2022-03-04 ENCOUNTER — Ambulatory Visit (INDEPENDENT_AMBULATORY_CARE_PROVIDER_SITE_OTHER): Payer: Medicare Other | Admitting: Orthopaedic Surgery

## 2022-03-04 DIAGNOSIS — S82831D Other fracture of upper and lower end of right fibula, subsequent encounter for closed fracture with routine healing: Secondary | ICD-10-CM | POA: Diagnosis not present

## 2022-03-04 DIAGNOSIS — S82409A Unspecified fracture of shaft of unspecified fibula, initial encounter for closed fracture: Secondary | ICD-10-CM | POA: Insufficient documentation

## 2022-03-04 NOTE — Progress Notes (Signed)
? ?Office Visit Note ?  ?Patient: Troy House           ?Date of Birth: 26-Jul-1946           ?MRN: [id number] ?Visit Date: 03/04/2022 ?             ?Requested by: Deland Pretty, MD ?[place],  [place] [address] ?PCP: Deland Pretty, MD ? ? ?Assessment & Plan: ?Visit Diagnoses:  ?1. Closed fracture of proximal end of right fibula with routine healing, unspecified fracture morphology, subsequent encounter   ? ? ?Plan: Troy House fell about a month ago and injured his right proximal leg.  He was seen by Haynes Dage with x-rays demonstrating a nondisplaced oblique fracture of the proximal fibula.  He was placed in equalizer boot and now relates he is doing very well.  He is not having any pain and does not need the boot.  He walks without a limp.  He has had a residual problem with his right lower extremity from years ago when developed what appears to have been a ruptured disc with subsequent surgery.  He notes that the most part that function has returned and no residual from this particular injury.  He also has been seen by the neurologist and they think that his frequent falls may be related to his cervical spine and he might be looking at surgery for decompression.  He feels like his legs will get weak and he like to try a course of physical therapy.  We will set this up for him at [facility].  We will plan to see him back as needed ? ?Follow-Up Instructions: Return if symptoms worsen or fail to improve.  ? ?Orders:  ?No orders of the defined types were placed in this encounter. ? ?No orders of the defined types were placed in this encounter. ? ? ? ? Procedures: ?No procedures performed ? ? ?Clinical Data: ?No additional findings. ? ? ?Subjective: ?Chief Complaint  ?Patient presents with  ? Right Knee - Pain, Follow-up  ?Troy House relates he is doing quite well with his right lower extremity.  He no longer uses the equalizer boot and walks without a limp.  He has been seen by the neurologist  and I think his frequent falls related to the cervical spine that he might "need surgery".  He also notes that he has had some residual weakness of his right lower extremity from her previous ruptured disc.  He had successful surgery and most of the problem has resolved.  He does not think there is anything new with his leg weakness related to his fracture. ? ?HPI ? ?Review of Systems ? ? ?Objective: ?Vital Signs: There were no vitals taken for this visit. ? ?Physical Exam ?Constitutional:   ?   Appearance: He is well-developed.  ?Eyes:  ?   Pupils: Pupils are equal, round, and reactive to light.  ?Pulmonary:  ?   Effort: Pulmonary effort is normal.  ?Skin: ?   General: Skin is warm and dry.  ?Neurological:  ?   Mental Status: He is alert and oriented to person, place, and time.  ?Psychiatric:     ?   Behavior: Behavior normal.  ? ? ?Ortho Exam awake alert and oriented x3.  Comfortable sitting walks without a limp.  There was no Tinel's or pain over the proximal right fibula in the area of his fracture.  No knee pain or effusion.  Full extension and flexion.  I thought  he had excellent strength in dorsiflexion of his toes and his foot but he does note there is some residual decreased sensibility in the distribution of his peroneal nerve from years ago.  That has not changed.Marland Kitchen  He was not wearing any type of orthotic or splint ? ?Specialty Comments:  ?No specialty comments available. ? ?Imaging: ?No results found. ? ? ?PMFS History: ?Patient Active Problem List  ? Diagnosis Date Noted  ? Fibula fracture 03/04/2022  ? Closed fracture of fibula, shaft 02/01/2022  ? Thoracic aortic aneurysm without rupture ([place]) 05/19/2020  ? Anemia 12/14/2019  ? Right knee pain 10/07/2015  ? COLONIC POLYPS, HX OF 12/09/2010  ? ANEMIA, PERNICIOUS 06/17/2010  ? HYPERLIPIDEMIA 12/05/2008  ? HYPERTENSION 12/05/2008  ? HYPERPLASIA PROSTATE UNS W/O UR OBST & OTH LUTS 12/05/2008  ? FASTING HYPERGLYCEMIA 12/05/2008  ? GOUT 12/04/2007  ? ?Past  Medical History:  ?Diagnosis Date  ? Anemia   ? borderline, PMH of 2000 (B12,folate, iron panel WNL)   ? B12 deficiency   ? BPH (benign prostatic hypertrophy)   ? Gout   ? PMH of (last flare 2006)  ? Hx of colonic polyps   ? PMH of 2011  ? Hyperglycemia   ? Hyperlipidemia   ? Hypertension   ? Skin cancer   ? right ankle  ?  ?Family History  ?Problem Relation Age of Onset  ? Hypertension Father   ? Stroke Father 15  ? Heart attack Father 56  ? Lupus Sister   ? Diabetes Sister   ?     resolved post Bariatric surgery  ? Thyroid cancer Sister   ? Hyperlipidemia Maternal Aunt   ? Dementia Maternal Grandfather   ? Cancer Maternal Grandfather   ?     ? primary  ? Mental illness Other   ?     bipolar  ? ADD / ADHD Other   ? ADD / ADHD Other   ? Colon polyps Neg Hx   ? Colon cancer Neg Hx   ? Esophageal cancer Neg Hx   ? Stomach cancer Neg Hx   ? Rectal cancer Neg Hx   ?  ?Past Surgical History:  ?Procedure Laterality Date  ? COLONOSCOPY  03/11/2015  ? COLONOSCOPY W/ POLYPECTOMY  03/2010  ? tubular adenoma, Dr Deatra Ina  ? ELBOW SURGERY    ? bilaterally for osteophytes  ? KNEE ARTHROSCOPY    ? left  ? lump removed    ? L breast- benign  ? POLYPECTOMY    ? REFRACTIVE SURGERY    ? OD X 2  ? SHOULDER SURGERY  2013  ?  Dr Venetia Maxon spurs right  ? surgery for frozen shoulder  2004  ? left  ? VASECTOMY    ? ?Social History  ? ?Occupational History  ? Occupation: Oceanographer  ?  Employer: RETIRED  ? Occupation: owns Theatre manager business  ?Tobacco Use  ? Smoking status: Former  ?  Types: Cigarettes  ?  Quit date: 11/08/1972  ?  Years since quitting: 49.3  ? Smokeless tobacco: Never  ? Tobacco comments:  ?  smoked age [age] , up to 3 ppd  ?Vaping Use  ? Vaping Use: Never used  ?Substance and Sexual Activity  ? Alcohol use: Yes  ?  Alcohol/week: 7.0 - 14.0 standard drinks  ?  Types: 7 - 14 Cans of beer per week  ? Drug use: Yes  ?  Types: Marijuana  ?  Comment: once a month, last  used 04/22/2020  ? Sexual activity: Not on file   ? ? ? ?Garald Balding, MD ? ? ?Note - This record has been created using [employer].  ?Chart creation errors have been sought, but may not always  ?have been located. Such creation errors do not reflect on  ?the standard of medical care. ? ?

## 2022-03-10 DIAGNOSIS — Z125 Encounter for screening for malignant neoplasm of prostate: Secondary | ICD-10-CM | POA: Diagnosis not present

## 2022-03-10 DIAGNOSIS — E7801 Familial hypercholesterolemia: Secondary | ICD-10-CM | POA: Diagnosis not present

## 2022-03-10 DIAGNOSIS — R7303 Prediabetes: Secondary | ICD-10-CM | POA: Diagnosis not present

## 2022-03-10 DIAGNOSIS — I10 Essential (primary) hypertension: Secondary | ICD-10-CM | POA: Diagnosis not present

## 2022-03-15 DIAGNOSIS — I2510 Atherosclerotic heart disease of native coronary artery without angina pectoris: Secondary | ICD-10-CM | POA: Diagnosis not present

## 2022-03-15 DIAGNOSIS — N1831 Chronic kidney disease, stage 3a: Secondary | ICD-10-CM | POA: Diagnosis not present

## 2022-03-15 DIAGNOSIS — R600 Localized edema: Secondary | ICD-10-CM | POA: Diagnosis not present

## 2022-03-15 DIAGNOSIS — I10 Essential (primary) hypertension: Secondary | ICD-10-CM | POA: Diagnosis not present

## 2022-03-15 DIAGNOSIS — D649 Anemia, unspecified: Secondary | ICD-10-CM | POA: Diagnosis not present

## 2022-03-15 DIAGNOSIS — I700 Atherosclerosis of aorta: Secondary | ICD-10-CM | POA: Diagnosis not present

## 2022-03-15 DIAGNOSIS — F5101 Primary insomnia: Secondary | ICD-10-CM | POA: Diagnosis not present

## 2022-03-15 DIAGNOSIS — Z0000 Encounter for general adult medical examination without abnormal findings: Secondary | ICD-10-CM | POA: Diagnosis not present

## 2022-03-15 DIAGNOSIS — N401 Enlarged prostate with lower urinary tract symptoms: Secondary | ICD-10-CM | POA: Diagnosis not present

## 2022-03-16 DIAGNOSIS — M4312 Spondylolisthesis, cervical region: Secondary | ICD-10-CM | POA: Diagnosis not present

## 2022-03-16 DIAGNOSIS — M5416 Radiculopathy, lumbar region: Secondary | ICD-10-CM | POA: Diagnosis not present

## 2022-03-16 DIAGNOSIS — R29898 Other symptoms and signs involving the musculoskeletal system: Secondary | ICD-10-CM | POA: Diagnosis not present

## 2022-03-16 DIAGNOSIS — G959 Disease of spinal cord, unspecified: Secondary | ICD-10-CM | POA: Diagnosis not present

## 2022-03-16 DIAGNOSIS — M4712 Other spondylosis with myelopathy, cervical region: Secondary | ICD-10-CM | POA: Diagnosis not present

## 2022-03-16 DIAGNOSIS — M2578 Osteophyte, vertebrae: Secondary | ICD-10-CM | POA: Diagnosis not present

## 2022-03-16 DIAGNOSIS — R2689 Other abnormalities of gait and mobility: Secondary | ICD-10-CM | POA: Diagnosis not present

## 2022-03-16 DIAGNOSIS — M4802 Spinal stenosis, cervical region: Secondary | ICD-10-CM | POA: Diagnosis not present

## 2022-03-16 DIAGNOSIS — Z9181 History of falling: Secondary | ICD-10-CM | POA: Diagnosis not present

## 2022-03-16 DIAGNOSIS — G629 Polyneuropathy, unspecified: Secondary | ICD-10-CM | POA: Diagnosis not present

## 2022-03-16 DIAGNOSIS — R296 Repeated falls: Secondary | ICD-10-CM | POA: Diagnosis not present

## 2022-03-16 DIAGNOSIS — M5001 Cervical disc disorder with myelopathy,  high cervical region: Secondary | ICD-10-CM | POA: Diagnosis not present

## 2022-03-23 DIAGNOSIS — N1831 Chronic kidney disease, stage 3a: Secondary | ICD-10-CM | POA: Diagnosis not present

## 2022-03-23 DIAGNOSIS — G959 Disease of spinal cord, unspecified: Secondary | ICD-10-CM | POA: Diagnosis not present

## 2022-03-29 DIAGNOSIS — M4712 Other spondylosis with myelopathy, cervical region: Secondary | ICD-10-CM | POA: Diagnosis not present

## 2022-03-29 DIAGNOSIS — N400 Enlarged prostate without lower urinary tract symptoms: Secondary | ICD-10-CM | POA: Diagnosis not present

## 2022-03-29 DIAGNOSIS — G4700 Insomnia, unspecified: Secondary | ICD-10-CM | POA: Diagnosis not present

## 2022-03-29 DIAGNOSIS — I129 Hypertensive chronic kidney disease with stage 1 through stage 4 chronic kidney disease, or unspecified chronic kidney disease: Secondary | ICD-10-CM | POA: Diagnosis not present

## 2022-03-29 DIAGNOSIS — N1831 Chronic kidney disease, stage 3a: Secondary | ICD-10-CM | POA: Diagnosis not present

## 2022-03-29 DIAGNOSIS — R296 Repeated falls: Secondary | ICD-10-CM | POA: Diagnosis not present

## 2022-03-29 DIAGNOSIS — Z87891 Personal history of nicotine dependence: Secondary | ICD-10-CM | POA: Diagnosis not present

## 2022-03-29 DIAGNOSIS — Z79899 Other long term (current) drug therapy: Secondary | ICD-10-CM | POA: Diagnosis not present

## 2022-03-29 DIAGNOSIS — M4802 Spinal stenosis, cervical region: Secondary | ICD-10-CM | POA: Diagnosis not present

## 2022-03-29 DIAGNOSIS — I77810 Thoracic aortic ectasia: Secondary | ICD-10-CM | POA: Diagnosis not present

## 2022-05-03 DIAGNOSIS — M4802 Spinal stenosis, cervical region: Secondary | ICD-10-CM | POA: Diagnosis not present

## 2022-05-03 DIAGNOSIS — Z4789 Encounter for other orthopedic aftercare: Secondary | ICD-10-CM | POA: Diagnosis not present

## 2022-05-03 DIAGNOSIS — R269 Unspecified abnormalities of gait and mobility: Secondary | ICD-10-CM | POA: Diagnosis not present

## 2022-05-04 DIAGNOSIS — R29898 Other symptoms and signs involving the musculoskeletal system: Secondary | ICD-10-CM | POA: Diagnosis not present

## 2022-05-04 DIAGNOSIS — I10 Essential (primary) hypertension: Secondary | ICD-10-CM | POA: Diagnosis not present

## 2022-05-06 DIAGNOSIS — H903 Sensorineural hearing loss, bilateral: Secondary | ICD-10-CM | POA: Diagnosis not present

## 2022-05-18 DIAGNOSIS — Q825 Congenital non-neoplastic nevus: Secondary | ICD-10-CM | POA: Diagnosis not present

## 2022-05-18 DIAGNOSIS — L570 Actinic keratosis: Secondary | ICD-10-CM | POA: Diagnosis not present

## 2022-05-18 DIAGNOSIS — B360 Pityriasis versicolor: Secondary | ICD-10-CM | POA: Diagnosis not present

## 2022-05-18 DIAGNOSIS — Z86018 Personal history of other benign neoplasm: Secondary | ICD-10-CM | POA: Diagnosis not present

## 2022-05-18 DIAGNOSIS — L578 Other skin changes due to chronic exposure to nonionizing radiation: Secondary | ICD-10-CM | POA: Diagnosis not present

## 2022-05-18 DIAGNOSIS — D2372 Other benign neoplasm of skin of left lower limb, including hip: Secondary | ICD-10-CM | POA: Diagnosis not present

## 2022-05-18 DIAGNOSIS — Z85828 Personal history of other malignant neoplasm of skin: Secondary | ICD-10-CM | POA: Diagnosis not present

## 2022-05-18 DIAGNOSIS — L821 Other seborrheic keratosis: Secondary | ICD-10-CM | POA: Diagnosis not present

## 2022-05-18 DIAGNOSIS — D225 Melanocytic nevi of trunk: Secondary | ICD-10-CM | POA: Diagnosis not present

## 2022-06-23 DIAGNOSIS — Z880 Allergy status to penicillin: Secondary | ICD-10-CM | POA: Diagnosis not present

## 2022-06-23 DIAGNOSIS — Z885 Allergy status to narcotic agent status: Secondary | ICD-10-CM | POA: Diagnosis not present

## 2022-06-23 DIAGNOSIS — Z4789 Encounter for other orthopedic aftercare: Secondary | ICD-10-CM | POA: Diagnosis not present

## 2022-08-10 DIAGNOSIS — I959 Hypotension, unspecified: Secondary | ICD-10-CM | POA: Diagnosis not present

## 2022-08-10 DIAGNOSIS — R42 Dizziness and giddiness: Secondary | ICD-10-CM | POA: Diagnosis not present

## 2022-08-11 DIAGNOSIS — D72825 Bandemia: Secondary | ICD-10-CM | POA: Diagnosis not present

## 2022-08-11 DIAGNOSIS — J40 Bronchitis, not specified as acute or chronic: Secondary | ICD-10-CM | POA: Diagnosis not present

## 2022-08-11 DIAGNOSIS — E871 Hypo-osmolality and hyponatremia: Secondary | ICD-10-CM | POA: Diagnosis not present

## 2022-08-16 DIAGNOSIS — G4762 Sleep related leg cramps: Secondary | ICD-10-CM | POA: Diagnosis not present

## 2022-08-16 DIAGNOSIS — I959 Hypotension, unspecified: Secondary | ICD-10-CM | POA: Diagnosis not present

## 2022-08-16 DIAGNOSIS — I10 Essential (primary) hypertension: Secondary | ICD-10-CM | POA: Diagnosis not present

## 2022-08-17 DIAGNOSIS — I959 Hypotension, unspecified: Secondary | ICD-10-CM | POA: Diagnosis not present

## 2022-09-16 DIAGNOSIS — I10 Essential (primary) hypertension: Secondary | ICD-10-CM | POA: Diagnosis not present

## 2022-09-16 DIAGNOSIS — Z23 Encounter for immunization: Secondary | ICD-10-CM | POA: Diagnosis not present

## 2022-10-14 DIAGNOSIS — I10 Essential (primary) hypertension: Secondary | ICD-10-CM | POA: Diagnosis not present

## 2022-11-11 DIAGNOSIS — I10 Essential (primary) hypertension: Secondary | ICD-10-CM | POA: Diagnosis not present

## 2022-12-07 DIAGNOSIS — I10 Essential (primary) hypertension: Secondary | ICD-10-CM | POA: Diagnosis not present

## 2022-12-07 DIAGNOSIS — R296 Repeated falls: Secondary | ICD-10-CM | POA: Diagnosis not present

## 2022-12-07 DIAGNOSIS — R413 Other amnesia: Secondary | ICD-10-CM | POA: Diagnosis not present

## 2022-12-17 DIAGNOSIS — Z961 Presence of intraocular lens: Secondary | ICD-10-CM | POA: Diagnosis not present

## 2022-12-21 DIAGNOSIS — I10 Essential (primary) hypertension: Secondary | ICD-10-CM | POA: Diagnosis not present

## 2023-01-06 DIAGNOSIS — I10 Essential (primary) hypertension: Secondary | ICD-10-CM | POA: Diagnosis not present

## 2023-01-06 DIAGNOSIS — E871 Hypo-osmolality and hyponatremia: Secondary | ICD-10-CM | POA: Diagnosis not present

## 2023-02-08 DIAGNOSIS — R413 Other amnesia: Secondary | ICD-10-CM | POA: Diagnosis not present

## 2023-02-08 DIAGNOSIS — R29898 Other symptoms and signs involving the musculoskeletal system: Secondary | ICD-10-CM | POA: Diagnosis not present

## 2023-02-12 DIAGNOSIS — R413 Other amnesia: Secondary | ICD-10-CM | POA: Diagnosis not present

## 2023-02-12 DIAGNOSIS — R531 Weakness: Secondary | ICD-10-CM | POA: Diagnosis not present

## 2023-03-08 DIAGNOSIS — R260 Ataxic gait: Secondary | ICD-10-CM | POA: Diagnosis not present

## 2023-03-08 DIAGNOSIS — R296 Repeated falls: Secondary | ICD-10-CM | POA: Diagnosis not present

## 2023-03-08 DIAGNOSIS — M6281 Muscle weakness (generalized): Secondary | ICD-10-CM | POA: Diagnosis not present

## 2023-03-18 DIAGNOSIS — R351 Nocturia: Secondary | ICD-10-CM | POA: Diagnosis not present

## 2023-03-18 DIAGNOSIS — I10 Essential (primary) hypertension: Secondary | ICD-10-CM | POA: Diagnosis not present

## 2023-03-18 DIAGNOSIS — R260 Ataxic gait: Secondary | ICD-10-CM | POA: Diagnosis not present

## 2023-03-18 DIAGNOSIS — I700 Atherosclerosis of aorta: Secondary | ICD-10-CM | POA: Diagnosis not present

## 2023-03-18 DIAGNOSIS — M109 Gout, unspecified: Secondary | ICD-10-CM | POA: Diagnosis not present

## 2023-03-18 DIAGNOSIS — R296 Repeated falls: Secondary | ICD-10-CM | POA: Diagnosis not present

## 2023-03-18 DIAGNOSIS — M6281 Muscle weakness (generalized): Secondary | ICD-10-CM | POA: Diagnosis not present

## 2023-03-18 DIAGNOSIS — I2510 Atherosclerotic heart disease of native coronary artery without angina pectoris: Secondary | ICD-10-CM | POA: Diagnosis not present

## 2023-03-18 DIAGNOSIS — R600 Localized edema: Secondary | ICD-10-CM | POA: Diagnosis not present

## 2023-03-18 DIAGNOSIS — Z125 Encounter for screening for malignant neoplasm of prostate: Secondary | ICD-10-CM | POA: Diagnosis not present

## 2023-03-18 DIAGNOSIS — N1831 Chronic kidney disease, stage 3a: Secondary | ICD-10-CM | POA: Diagnosis not present

## 2023-03-18 DIAGNOSIS — N401 Enlarged prostate with lower urinary tract symptoms: Secondary | ICD-10-CM | POA: Diagnosis not present

## 2023-03-18 DIAGNOSIS — I77819 Aortic ectasia, unspecified site: Secondary | ICD-10-CM | POA: Diagnosis not present

## 2023-03-18 DIAGNOSIS — Z0000 Encounter for general adult medical examination without abnormal findings: Secondary | ICD-10-CM | POA: Diagnosis not present

## 2023-03-18 DIAGNOSIS — R7303 Prediabetes: Secondary | ICD-10-CM | POA: Diagnosis not present

## 2023-03-23 DIAGNOSIS — R260 Ataxic gait: Secondary | ICD-10-CM | POA: Diagnosis not present

## 2023-03-23 DIAGNOSIS — R296 Repeated falls: Secondary | ICD-10-CM | POA: Diagnosis not present

## 2023-03-23 DIAGNOSIS — M6281 Muscle weakness (generalized): Secondary | ICD-10-CM | POA: Diagnosis not present

## 2023-03-24 DIAGNOSIS — R260 Ataxic gait: Secondary | ICD-10-CM | POA: Diagnosis not present

## 2023-03-24 DIAGNOSIS — M6281 Muscle weakness (generalized): Secondary | ICD-10-CM | POA: Diagnosis not present

## 2023-03-24 DIAGNOSIS — R296 Repeated falls: Secondary | ICD-10-CM | POA: Diagnosis not present

## 2023-03-28 DIAGNOSIS — R296 Repeated falls: Secondary | ICD-10-CM | POA: Diagnosis not present

## 2023-03-28 DIAGNOSIS — R260 Ataxic gait: Secondary | ICD-10-CM | POA: Diagnosis not present

## 2023-03-28 DIAGNOSIS — M6281 Muscle weakness (generalized): Secondary | ICD-10-CM | POA: Diagnosis not present

## 2023-03-30 DIAGNOSIS — R296 Repeated falls: Secondary | ICD-10-CM | POA: Diagnosis not present

## 2023-03-30 DIAGNOSIS — M6281 Muscle weakness (generalized): Secondary | ICD-10-CM | POA: Diagnosis not present

## 2023-03-30 DIAGNOSIS — R260 Ataxic gait: Secondary | ICD-10-CM | POA: Diagnosis not present

## 2023-04-11 DIAGNOSIS — R296 Repeated falls: Secondary | ICD-10-CM | POA: Diagnosis not present

## 2023-04-11 DIAGNOSIS — M6281 Muscle weakness (generalized): Secondary | ICD-10-CM | POA: Diagnosis not present

## 2023-04-11 DIAGNOSIS — R260 Ataxic gait: Secondary | ICD-10-CM | POA: Diagnosis not present

## 2023-04-14 DIAGNOSIS — R296 Repeated falls: Secondary | ICD-10-CM | POA: Diagnosis not present

## 2023-04-14 DIAGNOSIS — R260 Ataxic gait: Secondary | ICD-10-CM | POA: Diagnosis not present

## 2023-04-14 DIAGNOSIS — M6281 Muscle weakness (generalized): Secondary | ICD-10-CM | POA: Diagnosis not present

## 2023-04-25 DIAGNOSIS — N401 Enlarged prostate with lower urinary tract symptoms: Secondary | ICD-10-CM | POA: Diagnosis not present

## 2023-04-25 DIAGNOSIS — R351 Nocturia: Secondary | ICD-10-CM | POA: Diagnosis not present

## 2023-04-25 DIAGNOSIS — R3912 Poor urinary stream: Secondary | ICD-10-CM | POA: Diagnosis not present

## 2023-05-02 DIAGNOSIS — R419 Unspecified symptoms and signs involving cognitive functions and awareness: Secondary | ICD-10-CM | POA: Diagnosis not present

## 2023-05-20 DIAGNOSIS — D225 Melanocytic nevi of trunk: Secondary | ICD-10-CM | POA: Diagnosis not present

## 2023-05-20 DIAGNOSIS — L821 Other seborrheic keratosis: Secondary | ICD-10-CM | POA: Diagnosis not present

## 2023-05-20 DIAGNOSIS — Z86018 Personal history of other benign neoplasm: Secondary | ICD-10-CM | POA: Diagnosis not present

## 2023-05-20 DIAGNOSIS — Z85828 Personal history of other malignant neoplasm of skin: Secondary | ICD-10-CM | POA: Diagnosis not present

## 2023-05-20 DIAGNOSIS — C4442 Squamous cell carcinoma of skin of scalp and neck: Secondary | ICD-10-CM | POA: Diagnosis not present

## 2023-05-20 DIAGNOSIS — L603 Nail dystrophy: Secondary | ICD-10-CM | POA: Diagnosis not present

## 2023-05-20 DIAGNOSIS — D2372 Other benign neoplasm of skin of left lower limb, including hip: Secondary | ICD-10-CM | POA: Diagnosis not present

## 2023-05-20 DIAGNOSIS — Q825 Congenital non-neoplastic nevus: Secondary | ICD-10-CM | POA: Diagnosis not present

## 2023-05-20 DIAGNOSIS — D485 Neoplasm of uncertain behavior of skin: Secondary | ICD-10-CM | POA: Diagnosis not present

## 2023-05-20 DIAGNOSIS — L578 Other skin changes due to chronic exposure to nonionizing radiation: Secondary | ICD-10-CM | POA: Diagnosis not present

## 2023-07-04 DIAGNOSIS — C4442 Squamous cell carcinoma of skin of scalp and neck: Secondary | ICD-10-CM | POA: Diagnosis not present

## 2023-07-19 DIAGNOSIS — R7301 Impaired fasting glucose: Secondary | ICD-10-CM | POA: Diagnosis not present

## 2023-07-19 DIAGNOSIS — E7800 Pure hypercholesterolemia, unspecified: Secondary | ICD-10-CM | POA: Diagnosis not present

## 2023-07-19 DIAGNOSIS — I10 Essential (primary) hypertension: Secondary | ICD-10-CM | POA: Diagnosis not present

## 2023-07-19 DIAGNOSIS — E871 Hypo-osmolality and hyponatremia: Secondary | ICD-10-CM | POA: Diagnosis not present

## 2023-07-19 DIAGNOSIS — R296 Repeated falls: Secondary | ICD-10-CM | POA: Diagnosis not present

## 2023-08-09 DIAGNOSIS — R351 Nocturia: Secondary | ICD-10-CM | POA: Diagnosis not present

## 2023-08-09 DIAGNOSIS — R3912 Poor urinary stream: Secondary | ICD-10-CM | POA: Diagnosis not present

## 2023-08-15 DIAGNOSIS — N401 Enlarged prostate with lower urinary tract symptoms: Secondary | ICD-10-CM | POA: Diagnosis not present

## 2023-08-15 DIAGNOSIS — R351 Nocturia: Secondary | ICD-10-CM | POA: Diagnosis not present

## 2023-08-15 DIAGNOSIS — R3912 Poor urinary stream: Secondary | ICD-10-CM | POA: Diagnosis not present

## 2023-08-31 DIAGNOSIS — R413 Other amnesia: Secondary | ICD-10-CM | POA: Diagnosis not present

## 2023-08-31 DIAGNOSIS — R29898 Other symptoms and signs involving the musculoskeletal system: Secondary | ICD-10-CM | POA: Diagnosis not present

## 2023-09-16 ENCOUNTER — Other Ambulatory Visit: Payer: Self-pay | Admitting: Dermatology

## 2023-09-16 DIAGNOSIS — L570 Actinic keratosis: Secondary | ICD-10-CM | POA: Diagnosis not present

## 2023-09-16 DIAGNOSIS — N6320 Unspecified lump in the left breast, unspecified quadrant: Secondary | ICD-10-CM | POA: Diagnosis not present

## 2023-09-16 DIAGNOSIS — N63 Unspecified lump in unspecified breast: Secondary | ICD-10-CM

## 2023-09-16 DIAGNOSIS — T148XXA Other injury of unspecified body region, initial encounter: Secondary | ICD-10-CM | POA: Diagnosis not present

## 2023-09-19 ENCOUNTER — Encounter: Payer: Self-pay | Admitting: Gastroenterology

## 2023-10-03 ENCOUNTER — Ambulatory Visit
Admission: RE | Admit: 2023-10-03 | Discharge: 2023-10-03 | Disposition: A | Discharge status: Home / Self Care | Payer: Medicare Other | Source: Ambulatory Visit | Attending: Dermatology | Admitting: Dermatology

## 2023-10-03 DIAGNOSIS — N63 Unspecified lump in unspecified breast: Secondary | ICD-10-CM

## 2023-10-03 DIAGNOSIS — N6310 Unspecified lump in the right breast, unspecified quadrant: Secondary | ICD-10-CM | POA: Diagnosis not present

## 2023-10-03 DIAGNOSIS — N62 Hypertrophy of breast: Secondary | ICD-10-CM | POA: Diagnosis not present

## 2023-10-03 DIAGNOSIS — N644 Mastodynia: Secondary | ICD-10-CM | POA: Diagnosis not present

## 2023-11-29 DIAGNOSIS — E7800 Pure hypercholesterolemia, unspecified: Secondary | ICD-10-CM | POA: Diagnosis not present

## 2023-11-29 DIAGNOSIS — R7301 Impaired fasting glucose: Secondary | ICD-10-CM | POA: Diagnosis not present

## 2023-11-29 DIAGNOSIS — I10 Essential (primary) hypertension: Secondary | ICD-10-CM | POA: Diagnosis not present

## 2024-01-31 DIAGNOSIS — L570 Actinic keratosis: Secondary | ICD-10-CM | POA: Diagnosis not present

## 2024-01-31 DIAGNOSIS — B351 Tinea unguium: Secondary | ICD-10-CM | POA: Diagnosis not present

## 2024-01-31 DIAGNOSIS — L603 Nail dystrophy: Secondary | ICD-10-CM | POA: Diagnosis not present

## 2024-01-31 DIAGNOSIS — L821 Other seborrheic keratosis: Secondary | ICD-10-CM | POA: Diagnosis not present

## 2024-02-21 DIAGNOSIS — R3912 Poor urinary stream: Secondary | ICD-10-CM | POA: Diagnosis not present

## 2024-02-21 DIAGNOSIS — N401 Enlarged prostate with lower urinary tract symptoms: Secondary | ICD-10-CM | POA: Diagnosis not present

## 2024-02-21 DIAGNOSIS — R351 Nocturia: Secondary | ICD-10-CM | POA: Diagnosis not present

## 2024-03-20 DIAGNOSIS — Z125 Encounter for screening for malignant neoplasm of prostate: Secondary | ICD-10-CM | POA: Diagnosis not present

## 2024-03-20 DIAGNOSIS — I2510 Atherosclerotic heart disease of native coronary artery without angina pectoris: Secondary | ICD-10-CM | POA: Diagnosis not present

## 2024-03-20 DIAGNOSIS — I10 Essential (primary) hypertension: Secondary | ICD-10-CM | POA: Diagnosis not present

## 2024-03-20 DIAGNOSIS — R7303 Prediabetes: Secondary | ICD-10-CM | POA: Diagnosis not present

## 2024-03-20 DIAGNOSIS — D649 Anemia, unspecified: Secondary | ICD-10-CM | POA: Diagnosis not present

## 2024-03-23 ENCOUNTER — Emergency Department (HOSPITAL_COMMUNITY)
Admission: EM | Admit: 2024-03-23 | Discharge: 2024-03-23 | Disposition: A | Discharge status: Home / Self Care | Attending: Emergency Medicine | Admitting: Emergency Medicine

## 2024-03-23 ENCOUNTER — Other Ambulatory Visit: Payer: Self-pay

## 2024-03-23 DIAGNOSIS — Z85828 Personal history of other malignant neoplasm of skin: Secondary | ICD-10-CM | POA: Insufficient documentation

## 2024-03-23 DIAGNOSIS — Z79899 Other long term (current) drug therapy: Secondary | ICD-10-CM | POA: Insufficient documentation

## 2024-03-23 DIAGNOSIS — I959 Hypotension, unspecified: Secondary | ICD-10-CM | POA: Insufficient documentation

## 2024-03-23 DIAGNOSIS — Z7982 Long term (current) use of aspirin: Secondary | ICD-10-CM | POA: Diagnosis not present

## 2024-03-23 DIAGNOSIS — I2510 Atherosclerotic heart disease of native coronary artery without angina pectoris: Secondary | ICD-10-CM | POA: Diagnosis not present

## 2024-03-23 DIAGNOSIS — M109 Gout, unspecified: Secondary | ICD-10-CM | POA: Diagnosis not present

## 2024-03-23 DIAGNOSIS — Z0000 Encounter for general adult medical examination without abnormal findings: Secondary | ICD-10-CM | POA: Diagnosis not present

## 2024-03-23 DIAGNOSIS — I10 Essential (primary) hypertension: Secondary | ICD-10-CM | POA: Insufficient documentation

## 2024-03-23 DIAGNOSIS — Z87891 Personal history of nicotine dependence: Secondary | ICD-10-CM | POA: Diagnosis not present

## 2024-03-23 DIAGNOSIS — N401 Enlarged prostate with lower urinary tract symptoms: Secondary | ICD-10-CM | POA: Diagnosis not present

## 2024-03-23 DIAGNOSIS — R531 Weakness: Secondary | ICD-10-CM | POA: Diagnosis not present

## 2024-03-23 DIAGNOSIS — R001 Bradycardia, unspecified: Secondary | ICD-10-CM | POA: Diagnosis not present

## 2024-03-23 DIAGNOSIS — N529 Male erectile dysfunction, unspecified: Secondary | ICD-10-CM | POA: Diagnosis not present

## 2024-03-23 DIAGNOSIS — D539 Nutritional anemia, unspecified: Secondary | ICD-10-CM | POA: Diagnosis not present

## 2024-03-23 DIAGNOSIS — I700 Atherosclerosis of aorta: Secondary | ICD-10-CM | POA: Diagnosis not present

## 2024-03-23 LAB — COMPREHENSIVE METABOLIC PANEL WITH GFR
ALT: 23 U/L (ref 0–44)
AST: 25 U/L (ref 15–41)
Albumin: 4 g/dL (ref 3.5–5.0)
Alkaline Phosphatase: 84 U/L (ref 38–126)
Anion gap: 13 (ref 5–15)
BUN: 19 mg/dL (ref 8–23)
CO2: 23 mmol/L (ref 22–32)
Calcium: 9.4 mg/dL (ref 8.9–10.3)
Chloride: 99 mmol/L (ref 98–111)
Creatinine, Ser: 1.37 mg/dL — ABNORMAL HIGH (ref 0.61–1.24)
GFR, Estimated: 53 mL/min — ABNORMAL LOW (ref >60)
Glucose, Bld: 101 mg/dL — ABNORMAL HIGH (ref 70–99)
Potassium: 3.8 mmol/L (ref 3.5–5.1)
Sodium: 135 mmol/L (ref 135–145)
Total Bilirubin: 0.6 mg/dL (ref 0.0–1.2)
Total Protein: 7 g/dL (ref 6.5–8.1)

## 2024-03-23 LAB — CBC
HCT: 34.5 % — ABNORMAL LOW (ref 39.0–52.0)
Hemoglobin: 11.9 g/dL — ABNORMAL LOW (ref 13.0–17.0)
MCH: 33.1 pg (ref 26.0–34.0)
MCHC: 34.5 g/dL (ref 30.0–36.0)
MCV: 95.8 fL (ref 80.0–100.0)
Platelets: 293 10*3/uL (ref 150–400)
RBC: 3.6 MIL/uL — ABNORMAL LOW (ref 4.22–5.81)
RDW: 12.5 % (ref 11.5–15.5)
WBC: 8.2 10*3/uL (ref 4.0–10.5)
nRBC: 0 % (ref 0.0–0.2)

## 2024-03-23 LAB — CBG MONITORING, ED: Glucose-Capillary: 110 mg/dL — ABNORMAL HIGH (ref 70–99)

## 2024-03-23 LAB — TSH: TSH: 1.77 u[IU]/mL (ref 0.350–4.500)

## 2024-03-23 NOTE — ED Provider Triage Note (Signed)
 Emergency Medicine Provider Triage Evaluation Note  Troy House , a 78 y.o. male  was evaluated in triage.  Pt complains of low blood pressure and heart rate today while at PCPs office.  Patient reports for several years now he has been having intermittent episodes of falls.  He reports his legs will just give way but then he has a brief loss of consciousness after the fall.  The most recent episode was 3 weeks ago.  He denies any chest pain or shortness of breath.  No recent medication changes.  Review of Systems  Positive: Syncope Negative: Chest pain, shortness of breath  Physical Exam  BP 139/76 (BP Location: Right Arm)   Pulse (!) 58   Temp 97.9 F (36.6 C) (Oral)   Resp 17   Ht 5\' 10"  (1.778 m)   Wt 37.6 kg   SpO2 100%   BMI 11.91 kg/m  Gen:   Awake, no distress   Resp:  Normal effort  MSK:   Moves extremities without difficulty  Other:  No murmurs noted.  Heart rhythm is regular  Medical Decision Making  Medically screening exam initiated at 11:47 AM.  Appropriate orders placed.  Troy House was informed that the remainder of the evaluation will be completed by another provider, this initial triage assessment does not replace that evaluation, and the importance of remaining in the ED until their evaluation is complete.     Almond Army, MD 03/23/24 1148

## 2024-03-23 NOTE — ED Provider Notes (Signed)
 [place] EMERGENCY DEPARTMENT AT [facility] Provider Note  CSN: [id number] Arrival date & time: 03/23/24 1052  Chief Complaint(s) Hypotension, Weakness, and Loss of Consciousness  HPI Troy House is a 78 y.o. male who is here today for interval episodes of low blood pressure and a chronically low heart rate.  Patient was at his PCPs office, and had an episode where his heart rate was in the 50s, and his blood pressure was low, reportedly being 90/60.  Patient reports that his heart rate at baseline is typically about 60.  Says that over the last 5 years he has been having intermittent episodes of labile blood pressures.  He also says that sometimes his legs feel weak and he falls down.  This has been ongoing for 5 years.  Upon arrival to the ED, patient's heart rate was in the 50s, normotensive.   Past Medical History Past Medical History:  Diagnosis Date   Anemia    borderline, PMH of 2000 (B12,folate, iron panel WNL)    B12 deficiency    BPH (benign prostatic hypertrophy)    Gout    PMH of (last flare 2006)   Hx of colonic polyps    PMH of 2011   Hyperglycemia    Hyperlipidemia    Hypertension    Skin cancer    right ankle   Patient Active Problem List   Diagnosis Date Noted   Fibula fracture 03/04/2022   Closed fracture of fibula, shaft 02/01/2022   Thoracic aortic aneurysm without rupture (HCC) 05/19/2020   Anemia 12/14/2019   Right knee pain 10/07/2015   History of colonic polyps 12/09/2010   ANEMIA, PERNICIOUS 06/17/2010   HYPERLIPIDEMIA 12/05/2008   Essential hypertension 12/05/2008   HYPERPLASIA PROSTATE UNS W/O UR OBST & OTH LUTS 12/05/2008   FASTING HYPERGLYCEMIA 12/05/2008   GOUT 12/04/2007   Home Medication(s) Prior to Admission medications   Medication Sig Start Date End Date Taking? Authorizing Provider  aspirin 81 MG tablet Take 81 mg by mouth. 3 by mouth once daily    [provider]  carvedilol (COREG) 3.125 MG tablet  03/26/20    [provider]  Fluocinolone Acetonide 0.01 % OIL Place in ear(s) as needed. One drop in ear ear once daily, rx'ed by dermatologist    [provider]  furosemide (LASIX) 20 MG tablet  03/21/20   [provider]  gabapentin (NEURONTIN) 300 MG capsule 300 mg 2 (two) times daily.     [provider]  Ibuprofen 200 MG CAPS Take by mouth.    [provider]  NON FORMULARY [employer] Blood Buider-Take one pill daily    [provider]  potassium chloride (KLOR-CON) 10 MEQ tablet  03/21/20   [provider]  ramipril  (ALTACE ) 10 MG capsule TAKE 1 CAPSULE (10 MG TOTAL) BY MOUTH DAILY. 10/21/14   Alinda Apley, MD  Riboflavin (VITAMIN B2 PO) Take by mouth.    [provider]  rosuvastatin (CRESTOR) 10 MG tablet  04/15/20   [provider]  traMADol (ULTRAM) 50 MG tablet As needed    [provider]  traZODone (DESYREL) 50 MG tablet  04/10/20   [provider]  Past Surgical History Past Surgical History:  Procedure Laterality Date   BREAST EXCISIONAL BIOPSY Left    pt states benign excisional biopsy in late 1990's   COLONOSCOPY  03/11/2015   COLONOSCOPY W/ POLYPECTOMY  03/2010   tubular adenoma, Dr Arvie Latus   ELBOW SURGERY     bilaterally for osteophytes   KNEE ARTHROSCOPY     left   lump removed     L breast- benign   POLYPECTOMY     REFRACTIVE SURGERY     OD X 2   SHOULDER SURGERY  2013    Dr Verma Gobble spurs right   surgery for frozen shoulder  2004   left   VASECTOMY     Family History Family History  Problem Relation Age of Onset   Hypertension Father    Stroke Father 24   Heart attack Father 70   Lupus Sister    Diabetes Sister        resolved post Bariatric surgery   Thyroid  cancer Sister    Hyperlipidemia Maternal Aunt    Dementia Maternal  Grandfather    Cancer Maternal Grandfather        ? primary   Mental illness Other        bipolar   ADD / ADHD Other    ADD / ADHD Other    Colon polyps Neg Hx    Colon cancer Neg Hx    Esophageal cancer Neg Hx    Stomach cancer Neg Hx    Rectal cancer Neg Hx     Social History Social History   Tobacco Use   Smoking status: Former    Current packs/day: 0.00    Types: Cigarettes    Quit date: 11/08/1972    Years since quitting: 51.4   Smokeless tobacco: Never   Tobacco comments:    smoked age [age] , up to 3 ppd  Vaping Use   Vaping status: Never Used  Substance Use Topics   Alcohol use: Yes    Alcohol/week: 7.0 - 14.0 standard drinks of alcohol    Types: 7 - 14 Cans of beer per week   Drug use: Yes    Types: Marijuana    Comment: once a month, last used 04/22/2020   Allergies Hydrocodone, Penicillins, and Codeine  Review of Systems Review of Systems  Physical Exam Vital Signs  I have reviewed the triage vital signs BP (!) 158/72   Pulse (!) 55   Temp 97.7 F (36.5 C) (Oral)   Resp (!) 23   Ht 5\' 10"  (1.778 m)   Wt 37.6 kg   SpO2 100%   BMI 11.91 kg/m   Physical Exam Vitals reviewed.  HENT:     Head: Normocephalic.  Cardiovascular:     Rate and Rhythm: Normal rate.     Pulses: Normal pulses.  Pulmonary:     Effort: Pulmonary effort is normal.  Abdominal:     General: Abdomen is flat.  Musculoskeletal:        General: Normal range of motion.     Cervical back: Normal range of motion.  Skin:    General: Skin is warm.  Neurological:     General: No focal deficit present.     Mental Status: He is alert and oriented to person, place, and time.     ED Results and Treatments Labs (all labs ordered are listed, but only abnormal results are displayed) Labs Reviewed  COMPREHENSIVE METABOLIC PANEL WITH GFR - Abnormal; Notable for the  following components:      Result Value   Glucose, Bld 101 (*)    Creatinine, Ser 1.37 (*)    GFR, Estimated 53  (*)    All other components within normal limits  CBC - Abnormal; Notable for the following components:   RBC 3.60 (*)    Hemoglobin 11.9 (*)    HCT 34.5 (*)    All other components within normal limits  CBG MONITORING, ED - Abnormal; Notable for the following components:   Glucose-Capillary 110 (*)    All other components within normal limits  TSH                                                                                                                          Radiology No results found.  Pertinent labs & imaging results that were available during my care of the patient were reviewed by me and considered in my medical decision making (see MDM for details).  Medications Ordered in ED Medications - No data to display                                                                                                                                   Procedures Procedures  (including critical care time)  Medical Decision Making / ED Course   This patient presents to the ED for concern of labile blood pressure, bradycardia, this involves an extensive number of treatment options, and is a complaint that carries with it a high risk of complications and morbidity.  The differential diagnosis includes blood pressure variability, dehydration, thyroid  dysfunction, AV nodal block, chronic pericardia.  MDM: Going through the patient's medications, I do not see any AV nodal blocking agents.  There was a report of carvedilol, however the patient tells me he is not taking that medication.  Will check a TSH on the patient.  Did perform a bedside ultrasound which showed normal cardiac function.  My independent review the patient's EKG shows normal intervals, no evidence of heart block.  Appears to be a sinus bradycardia.  Will check basic labs on the patient.  Ultimately, with the symptoms ongoing 5 years, less likely to be any emergent cause.  Patient has not had any head strike.  He has a normal  neurological exam.  He has heart rates in the 50s from 2 years ago in our system.  This appears to be his  baseline.   Additional history obtained: -Additional history obtained from  -External records from outside source obtained and reviewed including: Chart review including previous notes, labs, imaging, consultation notes   Lab Tests: -I ordered, reviewed, and interpreted labs.   The pertinent results include:   Labs Reviewed  COMPREHENSIVE METABOLIC PANEL WITH GFR - Abnormal; Notable for the following components:      Result Value   Glucose, Bld 101 (*)    Creatinine, Ser 1.37 (*)    GFR, Estimated 53 (*)    All other components within normal limits  CBC - Abnormal; Notable for the following components:   RBC 3.60 (*)    Hemoglobin 11.9 (*)    HCT 34.5 (*)    All other components within normal limits  CBG MONITORING, ED - Abnormal; Notable for the following components:   Glucose-Capillary 110 (*)    All other components within normal limits  TSH      EKG my independent review of the patient's EKG shows no ST segment depressions or elevations, no T wave inversions, no evidence of acute ischemia.  EKG Interpretation Date/Time:  Friday Mar 23 2024 11:02:54 EDT Ventricular Rate:  59 PR Interval:  164 QRS Duration:  76 QT Interval:  434 QTC Calculation: 429 R Axis:   14  Text Interpretation: Sinus bradycardia Otherwise normal ECG No previous ECGs available Confirmed by Afton Horse [phone]) on 03/23/2024 12:57:15 PM         Imaging Studies ordered: I ordered imaging studies including chest x-ray I independently visualized and interpreted imaging. I agree with the radiologist interpretation   Medicines ordered and prescription drug management: No orders of the defined types were placed in this encounter.   -I have reviewed the patients home medicines and have made adjustments as needed  Cardiac Monitoring: The patient was maintained on a cardiac monitor.  I  personally viewed and interpreted the cardiac monitored which showed an underlying rhythm of: Normal sinus rhythm   Reevaluation: After the interventions noted above, I reevaluated the patient and found that they have :improved  Co morbidities that complicate the patient evaluation  Past Medical History:  Diagnosis Date   Anemia    borderline, PMH of 2000 (B12,folate, iron panel WNL)    B12 deficiency    BPH (benign prostatic hypertrophy)    Gout    PMH of (last flare 2006)   Hx of colonic polyps    PMH of 2011   Hyperglycemia    Hyperlipidemia    Hypertension    Skin cancer    right ankle      Dispostion: I considered admission for this patient, however this patient is appropriate for outpatient follow-up with his PCP.     Final Clinical Impression(s) / ED Diagnoses Final diagnoses:  Bradycardia     @PCDICTATION @    Afton Horse T, DO 03/23/24 1457

## 2024-03-23 NOTE — ED Triage Notes (Signed)
 Pt arrives via EMS from doctors office with c/o bradyacardia and hypotension. Pt reportedly hypotensive with standing. Pt reports multiple syncopal episodes over the last 4 years. Pt awake, alert, oriented x4, NAD.

## 2024-03-23 NOTE — Discharge Instructions (Signed)
 While you are in the emergency room, you had blood work done that was normal.  Your EKG is also normal.  Looking back to your system, you just have a low heart rate.  Your blood work that was done today was normal.  I would recommend following up with your regular doctor to discuss your blood pressure medications.

## 2024-05-01 DIAGNOSIS — I10 Essential (primary) hypertension: Secondary | ICD-10-CM | POA: Diagnosis not present

## 2024-05-01 DIAGNOSIS — R0683 Snoring: Secondary | ICD-10-CM | POA: Diagnosis not present

## 2024-05-01 DIAGNOSIS — I959 Hypotension, unspecified: Secondary | ICD-10-CM | POA: Diagnosis not present

## 2024-05-01 DIAGNOSIS — R001 Bradycardia, unspecified: Secondary | ICD-10-CM | POA: Diagnosis not present

## 2024-05-01 DIAGNOSIS — I950 Idiopathic hypotension: Secondary | ICD-10-CM | POA: Diagnosis not present

## 2024-05-01 DIAGNOSIS — E785 Hyperlipidemia, unspecified: Secondary | ICD-10-CM | POA: Diagnosis not present

## 2024-05-09 DIAGNOSIS — I10 Essential (primary) hypertension: Secondary | ICD-10-CM | POA: Diagnosis not present

## 2024-05-09 DIAGNOSIS — R001 Bradycardia, unspecified: Secondary | ICD-10-CM | POA: Diagnosis not present

## 2024-06-12 DIAGNOSIS — L578 Other skin changes due to chronic exposure to nonionizing radiation: Secondary | ICD-10-CM | POA: Diagnosis not present

## 2024-06-12 DIAGNOSIS — L570 Actinic keratosis: Secondary | ICD-10-CM | POA: Diagnosis not present

## 2024-06-12 DIAGNOSIS — L821 Other seborrheic keratosis: Secondary | ICD-10-CM | POA: Diagnosis not present

## 2024-06-12 DIAGNOSIS — Q825 Congenital non-neoplastic nevus: Secondary | ICD-10-CM | POA: Diagnosis not present

## 2024-06-12 DIAGNOSIS — D2372 Other benign neoplasm of skin of left lower limb, including hip: Secondary | ICD-10-CM | POA: Diagnosis not present

## 2024-06-12 DIAGNOSIS — B351 Tinea unguium: Secondary | ICD-10-CM | POA: Diagnosis not present

## 2024-06-12 DIAGNOSIS — Z85828 Personal history of other malignant neoplasm of skin: Secondary | ICD-10-CM | POA: Diagnosis not present

## 2024-06-12 DIAGNOSIS — Z86018 Personal history of other benign neoplasm: Secondary | ICD-10-CM | POA: Diagnosis not present

## 2024-06-12 DIAGNOSIS — D225 Melanocytic nevi of trunk: Secondary | ICD-10-CM | POA: Diagnosis not present

## 2024-06-12 DIAGNOSIS — L603 Nail dystrophy: Secondary | ICD-10-CM | POA: Diagnosis not present

## 2024-06-28 DIAGNOSIS — G4733 Obstructive sleep apnea (adult) (pediatric): Secondary | ICD-10-CM | POA: Diagnosis not present

## 2024-07-03 DIAGNOSIS — G4733 Obstructive sleep apnea (adult) (pediatric): Secondary | ICD-10-CM | POA: Diagnosis not present

## 2024-07-12 DIAGNOSIS — R29898 Other symptoms and signs involving the musculoskeletal system: Secondary | ICD-10-CM | POA: Diagnosis not present

## 2024-07-12 DIAGNOSIS — R413 Other amnesia: Secondary | ICD-10-CM | POA: Diagnosis not present

## 2024-07-30 DIAGNOSIS — G4733 Obstructive sleep apnea (adult) (pediatric): Secondary | ICD-10-CM | POA: Diagnosis not present

## 2024-07-31 DIAGNOSIS — I10 Essential (primary) hypertension: Secondary | ICD-10-CM | POA: Diagnosis not present

## 2024-07-31 DIAGNOSIS — E7800 Pure hypercholesterolemia, unspecified: Secondary | ICD-10-CM | POA: Diagnosis not present

## 2024-07-31 DIAGNOSIS — R7303 Prediabetes: Secondary | ICD-10-CM | POA: Diagnosis not present

## 2024-08-09 DIAGNOSIS — Z23 Encounter for immunization: Secondary | ICD-10-CM | POA: Diagnosis not present

## 2024-09-20 DIAGNOSIS — G4733 Obstructive sleep apnea (adult) (pediatric): Secondary | ICD-10-CM | POA: Diagnosis not present

## 2024-09-20 DIAGNOSIS — Z9989 Dependence on other enabling machines and devices: Secondary | ICD-10-CM | POA: Diagnosis not present

## 2024-09-24 DIAGNOSIS — H903 Sensorineural hearing loss, bilateral: Secondary | ICD-10-CM | POA: Diagnosis not present

## 2024-10-19 DIAGNOSIS — I10 Essential (primary) hypertension: Secondary | ICD-10-CM | POA: Diagnosis not present
